# Patient Record
Sex: Female | Born: 1990 | Race: White | Hispanic: No | Marital: Married | Smoking: Former smoker
Health system: Southern US, Community
[De-identification: ages and names within clinical notes are randomized; demographics above are authoritative.]

## PROBLEM LIST (undated history)

## (undated) ENCOUNTER — Inpatient Hospital Stay (HOSPITAL_COMMUNITY): Payer: Self-pay

## (undated) DIAGNOSIS — Z789 Other specified health status: Secondary | ICD-10-CM

## (undated) DIAGNOSIS — R011 Cardiac murmur, unspecified: Secondary | ICD-10-CM

## (undated) DIAGNOSIS — R87629 Unspecified abnormal cytological findings in specimens from vagina: Secondary | ICD-10-CM

## (undated) DIAGNOSIS — N809 Endometriosis, unspecified: Secondary | ICD-10-CM

## (undated) HISTORY — PX: MOUTH SURGERY: SHX715

## (undated) HISTORY — PX: COLPOSCOPY VULVA W/ BIOPSY: SUR282

---
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
## 2009-04-30 ENCOUNTER — Observation Stay (HOSPITAL_COMMUNITY): Admission: EM | Admit: 2009-04-30 | Discharge: 2009-04-30 | Payer: Self-pay | Admitting: Emergency Medicine

## 2009-09-19 ENCOUNTER — Emergency Department (HOSPITAL_COMMUNITY): Admission: EM | Admit: 2009-09-19 | Discharge: 2009-09-19 | Payer: Self-pay | Admitting: Family Medicine

## 2010-09-15 LAB — POCT URINALYSIS DIP (DEVICE)
Bilirubin Urine: NEGATIVE
Nitrite: POSITIVE — AB
Specific Gravity, Urine: 1.025 (ref 1.005–1.030)

## 2010-09-24 LAB — URINE MICROSCOPIC-ADD ON

## 2010-09-24 LAB — URINALYSIS, ROUTINE W REFLEX MICROSCOPIC
Bilirubin Urine: NEGATIVE
Leukocytes, UA: NEGATIVE
Specific Gravity, Urine: 1.03 (ref 1.005–1.030)

## 2010-09-24 LAB — POCT PREGNANCY, URINE: Preg Test, Ur: NEGATIVE

## 2011-03-01 ENCOUNTER — Encounter (HOSPITAL_COMMUNITY): Payer: Self-pay | Admitting: Obstetrics and Gynecology

## 2011-03-01 ENCOUNTER — Inpatient Hospital Stay (HOSPITAL_COMMUNITY)
Admission: AD | Admit: 2011-03-01 | Discharge: 2011-03-01 | Disposition: A | Discharge status: Home / Self Care | Payer: Self-pay | Source: Ambulatory Visit | Attending: Obstetrics & Gynecology | Admitting: Obstetrics & Gynecology

## 2011-03-01 DIAGNOSIS — N949 Unspecified condition associated with female genital organs and menstrual cycle: Secondary | ICD-10-CM | POA: Insufficient documentation

## 2011-03-01 DIAGNOSIS — B3731 Acute candidiasis of vulva and vagina: Secondary | ICD-10-CM

## 2011-03-01 DIAGNOSIS — B373 Candidiasis of vulva and vagina: Secondary | ICD-10-CM

## 2011-03-01 HISTORY — DX: Other specified health status: Z78.9

## 2011-03-01 LAB — WET PREP, GENITAL: Clue Cells Wet Prep HPF POC: NONE SEEN

## 2011-03-01 LAB — POCT PREGNANCY, URINE: Preg Test, Ur: NEGATIVE

## 2011-03-01 LAB — URINE MICROSCOPIC-ADD ON: RBC / HPF: NONE SEEN RBC/hpf (ref <3)

## 2011-03-01 LAB — URINALYSIS, ROUTINE W REFLEX MICROSCOPIC
Glucose, UA: NEGATIVE mg/dL
Hgb urine dipstick: NEGATIVE
Ketones, ur: NEGATIVE mg/dL
Protein, ur: NEGATIVE mg/dL
Specific Gravity, Urine: 1.01 (ref 1.005–1.030)

## 2011-03-01 MED ORDER — NYSTATIN-TRIAMCINOLONE 100000-0.1 UNIT/GM-% EX OINT: TOPICAL_OINTMENT | Freq: Two times a day (BID) | CUTANEOUS | Status: DC

## 2011-03-01 MED ORDER — FLUCONAZOLE 150 MG PO TABS
150.0000 mg | ORAL_TABLET | Freq: Once | ORAL | Status: AC
  Administered 2011-03-01: 150 mg via ORAL
  Filled 2011-03-01: qty 1

## 2011-03-01 MED ORDER — NYSTATIN-TRIAMCINOLONE 100000-0.1 UNIT/GM-% EX CREA
TOPICAL_CREAM | Freq: Two times a day (BID) | CUTANEOUS | Status: DC
  Filled 2011-03-01: qty 30

## 2011-03-01 MED ORDER — FLUCONAZOLE 150 MG PO TABS: 150.0000 mg | ORAL_TABLET | Freq: Once | ORAL | Status: AC

## 2011-03-01 NOTE — Progress Notes (Signed)
Having vaginal pain on left side no visible cut or laceration, vaginal discharge, no itching.

## 2011-03-01 NOTE — ED Provider Notes (Signed)
History     Chief Complaint  Patient presents with  . Vaginal Pain   HPI Pt is not pregnant and complanis of vaginal pain, especially on the left side. She called the office (Dr. Johnn Hai) 2 days ago and started using Terazol vaginal suppositories with some relief.  Pt has not been sexually active in 9 months.  Pt was checked for GC and Chlamydia in July at Dr. Johnn Hai office, which was negative.  Past Medical History  Diagnosis Date  . No pertinent past medical history     Past Surgical History  Procedure Date  . No past surgeries     No family history on file.  History  Substance Use Topics  . Smoking status: Never Smoker   . Smokeless tobacco: Not on file  . Alcohol Use: No    Allergies: No Known Allergies  Prescriptions prior to admission  Medication Sig Dispense Refill  . Cholecalciferol (VITAMIN D) 2000 UNITS tablet Take 2,000 Units by mouth at bedtime.        . Multiple Vitamin (MULTIVITAMIN) tablet Take 1 tablet by mouth at bedtime.        . naproxen sodium (ALEVE) 220 MG tablet Take 220 mg by mouth 2 (two) times daily as needed. For pain       . Phenyleph-Doxyl-DM-Aspirin (ALKA-SELTZER PLUS NIGHT COLD PO) Take 2 capsules by mouth at bedtime as needed. For cough/cold symptoms      . Probiotic Product (PROBIOTIC PO) Take 1 capsule by mouth at bedtime. Bifantis       . terconazole (TERAZOL 3) 80 MG vaginal suppository Place 80 mg vaginally at bedtime.         ROS Physical Exam   Blood pressure 126/84, pulse 107, temperature 98.7 F (37.1 C), temperature source Oral, resp. rate 16, height 4\' 11"  (1.499 m), weight 118 lb 12.8 oz (53.887 kg), last menstrual period 02/21/2011.  Physical Exam  Nursing note and vitals reviewed. Constitutional: She is oriented to person, place, and time. She appears well-developed and well-nourished.  HENT:  Head: Normocephalic.  Eyes: Pupils are equal, round, and reactive to light.  Neck: Normal range of motion. Neck supple.    Cardiovascular: Normal rate.   Respiratory: Effort normal.  GI: Soft. There is no tenderness.  Genitourinary:       Labia majora slightly edematous with left slightly more than right- no lesions, focal tenderness or ulcerations or fissures noted; small amount of yellow creamy discharge in vault; cervix clean nontender; adnexal without palpable enlargement or tenderness  Musculoskeletal: Normal range of motion.  Neurological: She is alert and oriented to person, place, and time.  Skin: Skin is warm and dry.  Psychiatric: She has a normal mood and affect.    MAU Course  Procedures  Exam with wet prep performed Will treat with Diflucan and Myclog  Assessment and Plan  Yeast vulvovaginitis Treat with Mycolog ointment and Diflucan  [name] [name] 03/01/2011, 6:28 PM

## 2011-03-01 NOTE — Progress Notes (Signed)
Pt states she is having vaginal pain. Pt states she feels like the pain is either on her labia or inside the vagina (> on left side). Pt states the pain started on 9/4. Pt is a G0

## 2011-03-02 NOTE — ED Provider Notes (Signed)
Agree with above note.  Mckell Riecke H. 03/02/2011 5:56 AM

## 2011-07-15 ENCOUNTER — Emergency Department (INDEPENDENT_AMBULATORY_CARE_PROVIDER_SITE_OTHER)
Admission: EM | Admit: 2011-07-15 | Discharge: 2011-07-15 | Disposition: A | Discharge status: Home / Self Care | Payer: Self-pay | Source: Home / Self Care

## 2011-07-15 ENCOUNTER — Encounter (HOSPITAL_COMMUNITY): Payer: Self-pay

## 2011-07-15 DIAGNOSIS — J019 Acute sinusitis, unspecified: Secondary | ICD-10-CM

## 2011-07-15 DIAGNOSIS — K5900 Constipation, unspecified: Secondary | ICD-10-CM

## 2011-07-15 DIAGNOSIS — J209 Acute bronchitis, unspecified: Secondary | ICD-10-CM

## 2011-07-15 MED ORDER — AMOXICILLIN 875 MG PO TABS: 875.0000 mg | ORAL_TABLET | Freq: Two times a day (BID) | ORAL | Status: AC

## 2011-07-15 NOTE — ED Notes (Signed)
C/o she has been sick w URI type syx for 3 weeks, , but this AM, had weakness in hand , and had problem holding spoon to feed child this AM; mother had Karlene Lineman (pt has not had any recent exposure tolive virus immunizations)

## 2011-07-15 NOTE — ED Provider Notes (Signed)
Medical screening examination/treatment/procedure(s) were performed by non-physician practitioner and as supervising physician I was immediately available for consultation/collaboration.  Raynald Blend, MD 07/15/11 1534

## 2011-07-15 NOTE — ED Provider Notes (Signed)
History     CSN: [id number]  Arrival date & time 07/15/11  1252   None     Chief Complaint  Patient presents with  . Dizziness    (Consider location/radiation/quality/duration/timing/severity/associated sxs/prior treatment) HPI Comments: Pt states she has been sick with cold symptoms for one month. She has yellow/green nasal mucus and a cough. The cough is sometimes productive with discolored phlegm. No shortness of breath or wheezing. She has tried otc cold medications without improvement. She has HA intermittently and fatigue. 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 She had difficulty holding a spoon to feed a baby because they felt so tired. The symptoms have since resolved. She also states that she intermittently has problems with constipation and is uncertain what to do with this. Most recently was constipated this past weekend but is having BMs now.    Past Medical History  Diagnosis Date  . No pertinent past medical history     Past Surgical History  Procedure Date  . No past surgeries     History reviewed. No pertinent family history.  History  Substance Use Topics  . Smoking status: Never Smoker   . Smokeless tobacco: Not on file  . Alcohol Use: No    OB History    Grav Para Term Preterm Abortions TAB SAB Ect Mult Living   0 0 0 0 0 0 0 0 0 0       Review of Systems  Constitutional: Negative for fever, chills and fatigue.  HENT: Positive for congestion, rhinorrhea and postnasal drip. Negative for ear pain, sore throat, sneezing, neck pain and sinus pressure.   Respiratory: Positive for cough. Negative for shortness of breath and wheezing.   Cardiovascular: Negative for chest pain and palpitations.    Allergies  Review of patient's allergies indicates no known allergies.  Home Medications   Current Outpatient Rx  Name Route Sig Dispense Refill  . VITAMIN D 2000 UNITS PO TABS Oral Take 2,000 Units by mouth at bedtime.        Marland Kitchen ONE-DAILY MULTI VITAMINS PO TABS Oral Take 1 tablet by mouth at bedtime.      Marland Kitchen NAPROXEN SODIUM 220 MG PO TABS Oral Take 220 mg by mouth 2 (two) times daily as needed. For pain     . ALKA-SELTZER PLUS NIGHT COLD PO Oral Take 2 capsules by mouth at bedtime as needed. For cough/cold symptoms    . PROBIOTIC PO Oral Take 1 capsule by mouth at bedtime. Bifantis     . AMOXICILLIN 875 MG PO TABS Oral Take 1 tablet (875 mg total) by mouth 2 (two) times daily. 20 tablet 0    BP 123/73  Pulse 76  Temp(Src) 98.3 F (36.8 C) (Oral)  Resp 18  SpO2 100%  LMP 07/10/2011  Physical Exam  Nursing note and vitals reviewed. Constitutional: She appears well-developed and well-nourished. No distress.  HENT:  Head: Normocephalic and atraumatic.  Right Ear: Tympanic membrane, external ear and ear canal normal.  Left Ear: Tympanic membrane, external ear and ear canal normal.  Nose: Mucosal edema (mild turbinate swelling and erythema bilat) present.  Mouth/Throat: Uvula is midline, oropharynx is clear and moist and mucous membranes are normal. No oropharyngeal exudate, posterior oropharyngeal edema or posterior oropharyngeal erythema.  Neck: Neck supple.  Cardiovascular: Normal rate, regular rhythm and normal heart sounds.   Pulses:      Radial pulses are 2+ on the right side, and 2+ on the left  side.       Dorsalis pedis pulses are 2+ on the right side, and 2+ on the left side.       Posterior tibial pulses are 2+ on the right side, and 2+ on the left side.       Cap refill < 2 sec  Pulmonary/Chest: Effort normal and breath sounds normal. No respiratory distress.  Lymphadenopathy:    She has no cervical adenopathy.  Neurological: She is alert. She has normal strength.  Reflex Scores:      Bicep reflexes are 2+ on the right side and 2+ on the left side.      Patellar reflexes are 2+ on the right side and 2+ on the left side. Skin: Skin is warm and dry.  Psychiatric: She has a normal mood and affect.     ED Course  Procedures (including critical care time)  Labs Reviewed - No data to display No results found.   1. Acute sinusitis   2. Acute bronchitis   3. Constipation       MDM  Sinus congestion and cough symptoms x 3-4 weeks. Episode of bilat UE weakness this am - neg exam.  Hx of recurrent constipation.         Melody Comas, [place] 07/15/11 1425

## 2011-11-22 ENCOUNTER — Emergency Department (HOSPITAL_COMMUNITY)
Admission: EM | Admit: 2011-11-22 | Discharge: 2011-11-22 | Disposition: A | Discharge status: Home / Self Care | Payer: Self-pay | Source: Home / Self Care | Attending: Emergency Medicine | Admitting: Emergency Medicine

## 2011-11-22 ENCOUNTER — Encounter (HOSPITAL_COMMUNITY): Payer: Self-pay | Admitting: Emergency Medicine

## 2011-11-22 DIAGNOSIS — K625 Hemorrhage of anus and rectum: Secondary | ICD-10-CM

## 2011-11-22 DIAGNOSIS — J069 Acute upper respiratory infection, unspecified: Secondary | ICD-10-CM

## 2011-11-22 NOTE — Discharge Instructions (Signed)
Drink LOTS of liquids. Try hot herbal tea (such as peppermint) with honey and fresh lemon squeezed into it (the lemon can really help sore throats). Try sudafed for congestion. Use a neti pot several times a day as needed to help manage your congestion and sore throat. Use only distilled water and salt without iodine in it (such as kosher or sea salt). You can also use the salt packets that come with some neti pots. If you mix your own saline, the recipe is 1 cup warm water to 1/4 teaspoon salt.   Increase the fiber in your diet.  If you continue to have rectal bleeding, if it's getting worse, go see your doctor about it.   Upper Respiratory Infection, Adult An upper respiratory infection (URI) is also sometimes known as the common cold. The upper respiratory tract includes the nose, sinuses, throat, trachea, and bronchi. Bronchi are the airways leading to the lungs. Most people improve within 1 week, but symptoms can last up to 2 weeks. A residual cough may last even longer.  CAUSES Many different viruses can infect the tissues lining the upper respiratory tract. The tissues become irritated and inflamed and often become very moist. Mucus production is also common. A cold is contagious. You can easily spread the virus to others by oral contact. This includes kissing, sharing a glass, coughing, or sneezing. Touching your mouth or nose and then touching a surface, which is then touched by another person, can also spread the virus. SYMPTOMS  Symptoms typically develop 1 to 3 days after you come in contact with a cold virus. Symptoms vary from person to person. They may include:  Runny nose.   Sneezing.   Nasal congestion.   Sinus irritation.   Sore throat.   Loss of voice (laryngitis).   Cough.   Fatigue.   Muscle aches.   Loss of appetite.   Headache.   Low-grade fever.  DIAGNOSIS  You might diagnose your own cold based on familiar symptoms, since most people get a cold 2 to 3 times  a year. Your caregiver can confirm this based on your exam. Most importantly, your caregiver can check that your symptoms are not due to another disease such as strep throat, sinusitis, pneumonia, asthma, or epiglottitis. Blood tests, throat tests, and X-rays are not necessary to diagnose a common cold, but they may sometimes be helpful in excluding other more serious diseases. Your caregiver will decide if any further tests are required. RISKS AND COMPLICATIONS  You may be at risk for a more severe case of the common cold if you smoke cigarettes, have chronic heart disease (such as heart failure) or lung disease (such as asthma), or if you have a weakened immune system. The very young and very old are also at risk for more serious infections. Bacterial sinusitis, middle ear infections, and bacterial pneumonia can complicate the common cold. The common cold can worsen asthma and chronic obstructive pulmonary disease (COPD). Sometimes, these complications can require emergency medical care and may be life-threatening. PREVENTION  The best way to protect against getting a cold is to practice good hygiene. Avoid oral or hand contact with people with cold symptoms. Wash your hands often if contact occurs. There is no clear evidence that vitamin C, vitamin E, echinacea, or exercise reduces the chance of developing a cold. However, it is always recommended to get plenty of rest and practice good nutrition. TREATMENT  Treatment is directed at relieving symptoms. There is no cure. Antibiotics are  not effective, because the infection is caused by a virus, not by bacteria. Treatment may include:  Increased fluid intake. Sports drinks offer valuable electrolytes, sugars, and fluids.   Breathing heated mist or steam (vaporizer or shower).   Eating chicken soup or other clear broths, and maintaining good nutrition.   Getting plenty of rest.   Using gargles or lozenges for comfort.   Controlling fevers with  ibuprofen or acetaminophen as directed by your caregiver.   Increasing usage of your inhaler if you have asthma.  Zinc gel and zinc lozenges, taken in the first 24 hours of the common cold, can shorten the duration and lessen the severity of symptoms. Pain medicines may help with fever, muscle aches, and throat pain. A variety of non-prescription medicines are available to treat congestion and runny nose. Your caregiver can make recommendations and may suggest nasal or lung inhalers for other symptoms.  HOME CARE INSTRUCTIONS   Only take over-the-counter or prescription medicines for pain, discomfort, or fever as directed by your caregiver.   Use a warm mist humidifier or inhale steam from a shower to increase air moisture. This may keep secretions moist and make it easier to breathe.   Drink enough water and fluids to keep your urine clear or pale yellow.   Rest as needed.   Return to work when your temperature has returned to normal or as your caregiver advises. You may need to stay home longer to avoid infecting others. You can also use a face mask and careful hand washing to prevent spread of the virus.  SEEK MEDICAL CARE IF:   After the first few days, you feel you are getting worse rather than better.   You need your caregiver's advice about medicines to control symptoms.   You develop chills, worsening shortness of breath, or brown or red sputum. These may be signs of pneumonia.   You develop yellow or brown nasal discharge or pain in the face, especially when you bend forward. These may be signs of sinusitis.   You develop a fever, swollen neck glands, pain with swallowing, or white areas in the back of your throat. These may be signs of strep throat.  SEEK IMMEDIATE MEDICAL CARE IF:   You have a fever.   You develop severe or persistent headache, ear pain, sinus pain, or chest pain.   You develop wheezing, a prolonged cough, cough up blood, or have a change in your usual  mucus (if you have chronic lung disease).   You develop sore muscles or a stiff neck.  Document Released: 12/02/2000 Document Revised: 05/28/2011 Document Reviewed: 10/10/2010 [facility] Patient Information 2012 [place], [place].  Rectal Bleeding  Rectal bleeding is when blood comes out of the opening of the butt (anus). Rectal bleeding may show up as bright red blood or really dark poop (stool). The poop may look dark red, maroon, or black. Rectal bleeding is often a sign that something is wrong. This needs to be checked by a doctor.  HOME CARE  Eat a diet high in fiber. This will help keep your poop soft.   Limit activitiy.   Drink enough fluids to keep your pee (urine) clear or pale yellow.   Take a warm bath to soothe any pain.   Follow up with your doctor as told.  GET HELP RIGHT AWAY IF:  You have more bleeding.   You have black or dark red poop.   You throw up (vomit) blood or it looks like coffee  grounds.   You have belly (abdominal) pain or tenderness.   You have a fever.   You feel weak, sick to your stomach (nauseous), or you pass out (faint).   You have pain that is so bad you cannot poop (bowel movement).  MAKE SURE YOU:  Understand these instructions.   Will watch your condition.   Will get help right away if you are not doing well or get worse.  Document Released: 02/18/2011 Document Revised: 05/28/2011 Document Reviewed: 02/18/2011 [facility] Patient Information 2012 [place], [place].

## 2011-11-22 NOTE — ED Provider Notes (Signed)
Medical screening examination/treatment/procedure(s) were performed by non-physician practitioner and as supervising physician I was immediately available for consultation/collaboration.  Marriana Hibberd, M.D.   Nishaan Stanke C Towana Stenglein, MD 11/22/11 2035 

## 2011-11-22 NOTE — ED Notes (Signed)
onset Thursday of symptoms: stuffy nose and head, cough, sore throat.  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  Denies rashes, denies fever.

## 2011-11-22 NOTE — ED Provider Notes (Signed)
History     CSN: [id number]  Arrival date & time 11/22/11  1600   First MD Initiated Contact with Patient 11/22/11 1651      Chief Complaint  Patient presents with  . Sore Throat    (Consider location/radiation/quality/duration/timing/severity/associated sxs/prior treatment) HPI Comments: Pt with nasal congestion, cough, sore throat, headache for 3 days.  Using otc cold meds for no relief.  Incidentally mentions small amount bright red rectal bleeding with stools for last 2 weeks. Occasionally constipated/has to strain.   Patient is a 21 y.o. female presenting with pharyngitis. The history is provided by the patient.  Sore Throat This is a new problem. The current episode started more than 2 days ago. The problem occurs constantly. The problem has not changed since onset.Pertinent negatives include no shortness of breath. The symptoms are aggravated by coughing. The symptoms are relieved by nothing. Treatments tried: otc cold meds. The treatment provided no relief.    Past Medical History  Diagnosis Date  . No pertinent past medical history     Past Surgical History  Procedure Date  . Mouth surgery     No family history on file.  History  Substance Use Topics  . Smoking status: Never Smoker   . Smokeless tobacco: Not on file  . Alcohol Use: No    OB History    Grav Para Term Preterm Abortions TAB SAB Ect Mult Living   0 0 0 0 0 0 0 0 0 0       Review of Systems  Constitutional: Negative for fever and chills.  HENT: Positive for congestion, sore throat, postnasal drip and sinus pressure. Negative for ear pain and ear discharge.   Respiratory: Positive for cough. Negative for shortness of breath and wheezing.   Gastrointestinal: Positive for anal bleeding.  Musculoskeletal: Positive for back pain.       Back pain with coughing    Allergies  Review of patient's allergies indicates no known allergies.  Home Medications   Current Outpatient Rx  Name Route Sig  Dispense Refill  . VICKS NATURE FUSION COLD & FLU PO Oral Take by mouth.    Marland Kitchen VITAMIN D 2000 UNITS PO TABS Oral Take 2,000 Units by mouth at bedtime.      Marland Kitchen ONE-DAILY MULTI VITAMINS PO TABS Oral Take 1 tablet by mouth at bedtime.      Marland Kitchen NAPROXEN SODIUM 220 MG PO TABS Oral Take 220 mg by mouth 2 (two) times daily as needed. For pain     . ALKA-SELTZER PLUS NIGHT COLD PO Oral Take 2 capsules by mouth at bedtime as needed. For cough/cold symptoms    . PROBIOTIC PO Oral Take 1 capsule by mouth at bedtime. Bifantis       BP 129/86  Pulse 118  Temp(Src) 98.6 F (37 C) (Oral)  Resp 20  SpO2 100%  LMP 11/22/2011  Physical Exam  Constitutional: She appears well-developed and well-nourished. No distress.  HENT:  Right Ear: External ear and ear canal normal. Tympanic membrane is retracted.  Left Ear: External ear and ear canal normal. Tympanic membrane is retracted.  Nose: Mucosal edema present.  Mouth/Throat: Oropharynx is clear and moist.  Cardiovascular: Normal rate and regular rhythm.   Pulmonary/Chest: Effort normal and breath sounds normal.  Genitourinary: Guaiac stool: refused rectal exam.  Lymphadenopathy:       Head (right side): No submental, no submandibular, no tonsillar, no preauricular and no posterior auricular adenopathy present.  Head (left side): No submental, no submandibular, no tonsillar, no preauricular and no posterior auricular adenopathy present.    She has no cervical adenopathy.    ED Course  Procedures (including critical care time)  Labs Reviewed - No data to display No results found.   1. URI (upper respiratory infection)   2. Rectal bleeding       MDM  Pt refused rectal exam. Sx sound most likely like fissure or hemorrhoids.  Pt has pcp and is to f/u with him if bleeding continues.          Cathlyn Parsons, NP 11/22/11 1701

## 2012-03-20 ENCOUNTER — Encounter (HOSPITAL_COMMUNITY): Payer: Self-pay | Admitting: Emergency Medicine

## 2012-03-20 ENCOUNTER — Emergency Department (INDEPENDENT_AMBULATORY_CARE_PROVIDER_SITE_OTHER)
Admission: EM | Admit: 2012-03-20 | Discharge: 2012-03-20 | Disposition: A | Discharge status: Home / Self Care | Payer: Self-pay | Source: Home / Self Care | Attending: Family Medicine | Admitting: Family Medicine

## 2012-03-20 DIAGNOSIS — Z041 Encounter for examination and observation following transport accident: Secondary | ICD-10-CM

## 2012-03-20 DIAGNOSIS — M549 Dorsalgia, unspecified: Secondary | ICD-10-CM

## 2012-03-20 MED ORDER — IBUPROFEN 800 MG PO TABS: 800.0000 mg | ORAL_TABLET | Freq: Three times a day (TID) | ORAL | Status: DC
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
## 2012-06-08 ENCOUNTER — Emergency Department (INDEPENDENT_AMBULATORY_CARE_PROVIDER_SITE_OTHER)
Admission: EM | Admit: 2012-06-08 | Discharge: 2012-06-08 | Disposition: A | Discharge status: Home / Self Care | Payer: Self-pay | Source: Home / Self Care | Attending: Family Medicine | Admitting: Family Medicine

## 2012-06-08 ENCOUNTER — Encounter (HOSPITAL_COMMUNITY): Payer: Self-pay | Admitting: *Deleted

## 2012-06-08 DIAGNOSIS — J020 Streptococcal pharyngitis: Secondary | ICD-10-CM

## 2012-06-08 LAB — POCT RAPID STREP A: Streptococcus, Group A Screen (Direct): NEGATIVE

## 2012-06-08 MED ORDER — AMOXICILLIN 500 MG PO CAPS: 500.0000 mg | ORAL_CAPSULE | Freq: Three times a day (TID) | ORAL | Status: DC

## 2012-06-08 NOTE — ED Notes (Signed)
Pt   Reports  Symptoms  Of  Body  Aches   sorethroat  As  Well   As  fever      And  Earache   Symptoms          Started  2  Days  Ago        Symptoms  Not  releived  By  otc  meds     Pt is  Masked  And  ios  In a  privatte  Room

## 2012-06-08 NOTE — ED Provider Notes (Signed)
History     CSN: [id number]  Arrival date & time 06/08/12  1548   First MD Initiated Contact with Patient 06/08/12 1706      Chief Complaint  Patient presents with  . Fever    (Consider location/radiation/quality/duration/timing/severity/associated sxs/prior treatment) Patient is a 21 y.o. female presenting with pharyngitis. The history is provided by the patient and a relative.  Sore Throat This is a new problem. The current episode started 2 days ago. The problem has been gradually worsening. The symptoms are aggravated by swallowing.    Past Medical History  Diagnosis Date  . No pertinent past medical history     Past Surgical History  Procedure Date  . Mouth surgery     History reviewed. No pertinent family history.  History  Substance Use Topics  . Smoking status: Never Smoker   . Smokeless tobacco: Not on file  . Alcohol Use: No    OB History    Grav Para Term Preterm Abortions TAB SAB Ect Mult Living   0 0 0 0 0 0 0 0 0 0       Review of Systems  Constitutional: Positive for fever and chills.  HENT: Positive for congestion, sore throat and rhinorrhea. Negative for trouble swallowing.   Respiratory: Positive for cough.   Gastrointestinal: Negative.   Musculoskeletal: Positive for myalgias.    Allergies  Review of patient's allergies indicates no known allergies.  Home Medications   Current Outpatient Rx  Name  Route  Sig  Dispense  Refill  . AMOXICILLIN 500 MG PO CAPS   Oral   Take 1 capsule (500 mg total) by mouth 3 (three) times daily.   30 capsule   0   . VITAMIN D 2000 UNITS PO TABS   Oral   Take 2,000 Units by mouth at bedtime.           Marland Kitchen VICKS NATURE FUSION COLD & FLU PO   Oral   Take by mouth.         . IBUPROFEN 800 MG PO TABS   Oral   Take 1 tablet (800 mg total) by mouth 3 (three) times daily.   30 tablet   0   . ONE-DAILY MULTI VITAMINS PO TABS   Oral   Take 1 tablet by mouth at bedtime.           Marland Kitchen NAPROXEN  SODIUM 220 MG PO TABS   Oral   Take 220 mg by mouth 2 (two) times daily as needed. For pain          . ALKA-SELTZER PLUS NIGHT COLD PO   Oral   Take 2 capsules by mouth at bedtime as needed. For cough/cold symptoms         . PROBIOTIC PO   Oral   Take 1 capsule by mouth at bedtime. Bifantis            BP 118/76  Pulse 112  Temp 100.6 F (38.1 C) (Oral)  Resp 20  SpO2 100%  LMP 06/02/2012  Physical Exam  Nursing note and vitals reviewed. Constitutional: She is oriented to person, place, and time. She appears well-developed and well-nourished.  HENT:  Head: Normocephalic.  Right Ear: External ear normal.  Left Ear: External ear normal.  Mouth/Throat: Mucous membranes are normal. Oropharyngeal exudate and posterior oropharyngeal erythema present.  Eyes: Pupils are equal, round, and reactive to light.  Cardiovascular: Normal rate, regular rhythm, normal heart sounds and intact distal pulses.  Pulmonary/Chest: Effort normal and breath sounds normal.  Lymphadenopathy:    She has cervical adenopathy.  Neurological: She is alert and oriented to person, place, and time.  Skin: Skin is warm and dry.    ED Course  Procedures (including critical care time)   Labs Reviewed  POCT RAPID STREP A ([facility] ONLY)   No results found.   1. Strep throat       MDM          Linna Hoff, MD 06/08/12 [phone]

## 2013-01-16 ENCOUNTER — Encounter (HOSPITAL_COMMUNITY): Payer: Self-pay | Admitting: Emergency Medicine

## 2013-01-16 ENCOUNTER — Emergency Department (INDEPENDENT_AMBULATORY_CARE_PROVIDER_SITE_OTHER)
Admission: EM | Admit: 2013-01-16 | Discharge: 2013-01-16 | Disposition: A | Discharge status: Home / Self Care | Payer: Self-pay | Source: Home / Self Care | Attending: Family Medicine | Admitting: Family Medicine

## 2013-01-16 DIAGNOSIS — J209 Acute bronchitis, unspecified: Secondary | ICD-10-CM

## 2013-01-16 MED ORDER — GUAIFENESIN-CODEINE 100-10 MG/5ML PO SOLN: 5.0000 mL | Freq: Three times a day (TID) | ORAL | Status: DC | PRN

## 2013-01-16 NOTE — ED Notes (Signed)
Pt c/o cold sxs onset 3 days... sxs include: cough w/green phlegm, sneezing, ST... Denies: f/v/n/d... Had tyle around 1600... Hx of bronchitis... Alert w/no signs of acute distres

## 2013-01-16 NOTE — ED Provider Notes (Signed)
CSN: [id number]     Arrival date & time 01/16/13  1822 History     First MD Initiated Contact with Patient 01/16/13 1949     Chief Complaint  Patient presents with  . URI   (Consider location/radiation/quality/duration/timing/severity/associated sxs/prior Treatment) HPI Comments: 22 year old female with productive cough, congestion, sore throat, runny nose for three days.  She says she has had bronchitis many times in the past and it feels just like this. She says she usually takes codeine cough syrup at night so she can sleep and waits it out. No fever, chills, SOB, pleuritic PX. 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 No recent travel.   Patient is a 22 y.o. female presenting with URI.  URI Presenting symptoms: cough and sore throat   Presenting symptoms: no congestion, no ear pain, no fever and no rhinorrhea   Associated symptoms: sneezing   Associated symptoms: no arthralgias, no myalgias and no neck pain     Past Medical History  Diagnosis Date  . No pertinent past medical history    Past Surgical History  Procedure Laterality Date  . Mouth surgery     No family history on file. History  Substance Use Topics  . Smoking status: Never Smoker   . Smokeless tobacco: Not on file  . Alcohol Use: No   OB History   Grav Para Term Preterm Abortions TAB SAB Ect Mult Living   0 0 0 0 0 0 0 0 0 0      Review of Systems  Constitutional: Negative for fever and chills.  HENT: Positive for sore throat and sneezing. Negative for ear pain, congestion, rhinorrhea, neck pain, postnasal drip and sinus pressure.   Eyes: Negative for visual disturbance.  Respiratory: Positive for cough. Negative for shortness of breath.   Cardiovascular: Negative for chest pain, palpitations and leg swelling.  Gastrointestinal: Negative for nausea, vomiting and abdominal pain.  Endocrine: Negative for polydipsia and polyuria.  Genitourinary: Negative for dysuria, urgency and  frequency.  Musculoskeletal: Negative for myalgias and arthralgias.  Skin: Negative for rash.  Neurological: Negative for dizziness, weakness and light-headedness.    Allergies  Review of patient's allergies indicates no known allergies.  Home Medications   Current Outpatient Rx  Name  Route  Sig  Dispense  Refill  . amoxicillin (AMOXIL) 500 MG capsule   Oral   Take 1 capsule (500 mg total) by mouth 3 (three) times daily.   30 capsule   0   . Cholecalciferol (VITAMIN D) 2000 UNITS tablet   Oral   Take 2,000 Units by mouth at bedtime.           Marland Kitchen DM-Phenylephrine-Acetaminophen (VICKS NATURE FUSION COLD & FLU PO)   Oral   Take by mouth.         Marland Kitchen guaiFENesin-codeine 100-10 MG/5ML syrup   Oral   Take 5 mLs by mouth 3 (three) times daily as needed for cough.   120 mL   0   . ibuprofen (ADVIL,MOTRIN) 800 MG tablet   Oral   Take 1 tablet (800 mg total) by mouth 3 (three) times daily.   30 tablet   0   . Multiple Vitamin (MULTIVITAMIN) tablet   Oral   Take 1 tablet by mouth at bedtime.           . naproxen sodium (ALEVE) 220 MG tablet   Oral   Take 220 mg by mouth 2 (two) times daily as  needed. For pain          . Phenyleph-Doxyl-DM-Aspirin (ALKA-SELTZER PLUS NIGHT COLD PO)   Oral   Take 2 capsules by mouth at bedtime as needed. For cough/cold symptoms         . Probiotic Product (PROBIOTIC PO)   Oral   Take 1 capsule by mouth at bedtime. Bifantis           BP 131/82  Pulse 75  Temp(Src) 98 F (36.7 C) (Oral)  Resp 16  SpO2 100%  LMP 01/07/2013 Physical Exam  Nursing note and vitals reviewed. Constitutional: She is oriented to person, place, and time. Vital signs are normal. She appears well-developed and well-nourished. No distress.  HENT:  Head: Normocephalic and atraumatic.  Right Ear: External ear normal.  Left Ear: External ear normal.  Nose: Nose normal.  Mouth/Throat: Oropharynx is clear and moist. No oropharyngeal exudate.  Eyes:  Conjunctivae and EOM are normal. Pupils are equal, round, and reactive to light. Right eye exhibits no discharge. Left eye exhibits no discharge.  Neck: Normal range of motion. Neck supple. No JVD present. No tracheal deviation present.  Cardiovascular: Normal rate, regular rhythm and normal heart sounds.  Exam reveals no gallop and no friction rub.   No murmur heard. Pulmonary/Chest: Effort normal. No respiratory distress. She has wheezes (mild in right lower lung field). She has no rales.  Lymphadenopathy:    She has no cervical adenopathy.  Neurological: She is alert and oriented to person, place, and time. She has normal strength.  Skin: Skin is warm and dry. She is not diaphoretic.  Psychiatric: She has a normal mood and affect. Her behavior is normal. Judgment normal.    ED Course   Procedures (including critical care time)  Labs Reviewed - No data to display No results found. 1. Acute bronchitis     MDM  Afebrile, without SOB, no pleuritic pain.  This is a viral URI.  Treat symptomatically, return if worsening    Meds ordered this encounter  Medications  . guaiFENesin-codeine 100-10 MG/5ML syrup    Sig: Take 5 mLs by mouth 3 (three) times daily as needed for cough.    Dispense:  120 mL    Refill:  0     Graylon Good, PA-C 01/17/13 [phone]

## 2013-01-17 NOTE — ED Provider Notes (Signed)
Medical screening examination/treatment/procedure(s) were performed by resident physician or non-physician practitioner and as supervising physician I was immediately available for consultation/collaboration.   Chukwuebuka Churchill DOUGLAS MD.   Chelsee Hosie D Nila Winker, MD 01/17/13 2110 

## 2013-11-01 ENCOUNTER — Other Ambulatory Visit: Payer: Self-pay | Admitting: Obstetrics & Gynecology

## 2013-11-01 DIAGNOSIS — N63 Unspecified lump in unspecified breast: Secondary | ICD-10-CM

## 2013-11-07 ENCOUNTER — Ambulatory Visit
Admission: RE | Admit: 2013-11-07 | Discharge: 2013-11-07 | Disposition: A | Discharge status: Home / Self Care | Payer: No Typology Code available for payment source | Source: Ambulatory Visit | Attending: Obstetrics & Gynecology | Admitting: Obstetrics & Gynecology

## 2013-11-07 ENCOUNTER — Encounter (INDEPENDENT_AMBULATORY_CARE_PROVIDER_SITE_OTHER): Payer: Self-pay

## 2013-11-07 DIAGNOSIS — N63 Unspecified lump in unspecified breast: Secondary | ICD-10-CM

## 2014-08-31 ENCOUNTER — Inpatient Hospital Stay (HOSPITAL_COMMUNITY)
Admission: AD | Admit: 2014-08-31 | Discharge: 2014-09-01 | Disposition: A | Discharge status: Home / Self Care | Payer: Self-pay | Source: Ambulatory Visit | Attending: Obstetrics and Gynecology | Admitting: Obstetrics and Gynecology

## 2014-08-31 DIAGNOSIS — Z87891 Personal history of nicotine dependence: Secondary | ICD-10-CM | POA: Insufficient documentation

## 2014-08-31 DIAGNOSIS — O209 Hemorrhage in early pregnancy, unspecified: Secondary | ICD-10-CM | POA: Insufficient documentation

## 2014-08-31 DIAGNOSIS — Z3A01 Less than 8 weeks gestation of pregnancy: Secondary | ICD-10-CM | POA: Insufficient documentation

## 2014-08-31 DIAGNOSIS — O4691 Antepartum hemorrhage, unspecified, first trimester: Secondary | ICD-10-CM

## 2014-08-31 HISTORY — DX: Unspecified abnormal cytological findings in specimens from vagina: R87.629

## 2014-08-31 NOTE — MAU Note (Signed)
Bleeding like a period for couple hours. Last BM 3 days ago and has been taking Miralax to help with constipation. Has had abd pressure from constipation and continues to have that feeling but no pain. Using metrogel this wk for BV.

## 2014-09-01 ENCOUNTER — Encounter (HOSPITAL_COMMUNITY): Payer: Self-pay | Admitting: *Deleted

## 2014-09-01 ENCOUNTER — Inpatient Hospital Stay (HOSPITAL_COMMUNITY): Payer: Self-pay

## 2014-09-01 LAB — HCG, QUANTITATIVE, PREGNANCY: hCG, Beta Chain, Quant, S: 57303 m[IU]/mL — ABNORMAL HIGH (ref <5)

## 2014-09-01 LAB — URINALYSIS, ROUTINE W REFLEX MICROSCOPIC
BILIRUBIN URINE: NEGATIVE
Glucose, UA: NEGATIVE mg/dL
KETONES UR: NEGATIVE mg/dL
Leukocytes, UA: NEGATIVE
Nitrite: NEGATIVE
Protein, ur: NEGATIVE mg/dL
Specific Gravity, Urine: 1.01 (ref 1.005–1.030)
Urobilinogen, UA: 0.2 mg/dL (ref 0.0–1.0)
pH: 6.5 (ref 5.0–8.0)

## 2014-09-01 LAB — CBC
HCT: 33.6 % — ABNORMAL LOW (ref 36.0–46.0)
Hemoglobin: 11.8 g/dL — ABNORMAL LOW (ref 12.0–15.0)
MCH: 29.9 pg (ref 26.0–34.0)
MCHC: 35.1 g/dL (ref 30.0–36.0)
MCV: 85.1 fL (ref 78.0–100.0)
PLATELETS: 279 10*3/uL (ref 150–400)
RBC: 3.95 MIL/uL (ref 3.87–5.11)
RDW: 12 % (ref 11.5–15.5)
WBC: 12.4 10*3/uL — ABNORMAL HIGH (ref 4.0–10.5)

## 2014-09-01 LAB — ABO/RH: ABO/RH(D): O POS

## 2014-09-01 LAB — URINE MICROSCOPIC-ADD ON

## 2014-09-01 LAB — POCT PREGNANCY, URINE: Preg Test, Ur: POSITIVE — AB

## 2014-09-01 NOTE — Discharge Instructions (Signed)
Pelvic Rest °Pelvic rest is sometimes recommended for women when:  °· The placenta is partially or completely covering the opening of the cervix (placenta previa). °· There is bleeding between the uterine wall and the amniotic sac in the first trimester (subchorionic hemorrhage). °· The cervix begins to open without labor starting (incompetent cervix, cervical insufficiency). °· The labor is too early (preterm labor). °HOME CARE INSTRUCTIONS °· Do not have sexual intercourse, stimulation, or an orgasm. °· Do not use tampons, douche, or put anything in the vagina. °· Do not lift anything over 10 pounds (4.5 kg). °· Avoid strenuous activity or straining your pelvic muscles. °SEEK MEDICAL CARE IF:  °· You have any vaginal bleeding during pregnancy. Treat this as a potential emergency. °· You have cramping pain felt low in the stomach (stronger than menstrual cramps). °· You notice vaginal discharge (watery, mucus, or bloody). °· You have a low, dull backache. °· There are regular contractions or uterine tightening. °SEEK IMMEDIATE MEDICAL CARE IF: °You have vaginal bleeding and have placenta previa.  °Document Released: 10/03/2010 Document Revised: 08/31/2011 Document Reviewed: 10/03/2010 °[employer]® Patient Information ©2015 [employer]. This information is not intended to replace advice given to you by your health care provider. Make sure you discuss any questions you have with your health care provider. ° °Vaginal Bleeding During Pregnancy, First Trimester °A small amount of bleeding (spotting) from the vagina is common in early pregnancy. Sometimes the bleeding is normal and is not a problem, and sometimes it is a sign of something serious. Be sure to tell your doctor about any bleeding from your vagina right away. °HOME CARE °· Watch your condition for any changes. °· Follow your doctor's instructions about how active you can be. °· If you are on bed rest: °¨ You may need to stay in bed and only get up to use the  bathroom. °¨ You may be allowed to do some activities. °¨ If you need help, make plans for someone to help you. °· Write down: °¨ The number of pads you use each day. °¨ How often you change pads. °¨ How soaked (saturated) your pads are. °· Do not use tampons. °· Do not douche. °· Do not have sex or orgasms until your doctor says it is okay. °· If you pass any tissue from your vagina, save the tissue so you can show it to your doctor. °· Only take medicines as told by your doctor. °· Do not take aspirin because it can make you bleed. °· Keep all follow-up visits as told by your doctor. °GET HELP IF:  °· You bleed from your vagina. °· You have cramps. °· You have labor pains. °· You have a fever that does not go away after you take medicine. °GET HELP RIGHT AWAY IF:  °· You have very bad cramps in your back or belly (abdomen). °· You pass large clots or tissue from your vagina. °· You bleed more. °· You feel light-headed or weak. °· You pass out (faint). °· You have chills. °· You are leaking fluid or have a gush of fluid from your vagina. °· You pass out while pooping (having a bowel movement). °MAKE SURE YOU: °· Understand these instructions. °· Will watch your condition. °· Will get help right away if you are not doing well or get worse. °Document Released: 10/23/2013 Document Reviewed: 02/13/2013 °[employer]Care® Patient Information ©2015 [employer]. This information is not intended to replace advice given to you by your health care provider. Make   sure you discuss any questions you have with your health care provider. ° °

## 2014-09-01 NOTE — MAU Provider Note (Signed)
History     CSN: [id number]  Arrival date and time: 08/31/14 2348   First Provider Initiated Contact with Patient 09/01/14 0020      Chief Complaint  Patient presents with  . Vaginal Bleeding   HPI  [name] [name] is a 24 y.o. female G1P0000 at [id number] who presents with new onset vaginal bleeding. The bleeding started a few hours ago and is heavy like a period.  Last intercourse was 48 hours ago.  Denies pain.    OB History    Gravida Para Term Preterm AB TAB SAB Ectopic Multiple Living   1 0 0 0 0 0 0 0 0 0       Past Medical History  Diagnosis Date  . No pertinent past medical history   . Vaginal Pap smear, abnormal     Past Surgical History  Procedure Laterality Date  . Mouth surgery    . Colposcopy vulva w/ biopsy      History reviewed. No pertinent family history.  History  Substance Use Topics  . Smoking status: Former Smoker    Quit date: 08/21/2014  . Smokeless tobacco: Not on file  . Alcohol Use: No    Allergies: No Known Allergies  Prescriptions prior to admission  Medication Sig Dispense Refill Last Dose  . amoxicillin (AMOXIL) 500 MG capsule Take 1 capsule (500 mg total) by mouth 3 (three) times daily. 30 capsule 0 Unknown at Unknown  . Cholecalciferol (VITAMIN D) 2000 UNITS tablet Take 2,000 Units by mouth at bedtime.     Unknown at Unknown  . DM-Phenylephrine-Acetaminophen (VICKS NATURE FUSION COLD & FLU PO) Take by mouth.   Unknown at Unknown  . guaiFENesin-codeine 100-10 MG/5ML syrup Take 5 mLs by mouth 3 (three) times daily as needed for cough. 120 mL 0   . ibuprofen (ADVIL,MOTRIN) 800 MG tablet Take 1 tablet (800 mg total) by mouth 3 (three) times daily. 30 tablet 0 Unknown at Unknown  . Multiple Vitamin (MULTIVITAMIN) tablet Take 1 tablet by mouth at bedtime.     Unknown at Unknown  . naproxen sodium (ALEVE) 220 MG tablet Take 220 mg by mouth 2 (two) times daily as needed. For pain    Unknown at Unknown  . Phenyleph-Doxyl-DM-Aspirin  (ALKA-SELTZER PLUS NIGHT COLD PO) Take 2 capsules by mouth at bedtime as needed. For cough/cold symptoms   Unknown at Unknown  . Probiotic Product (PROBIOTIC PO) Take 1 capsule by mouth at bedtime. Bifantis    Unknown at Unknown   Results for orders placed or performed during the hospital encounter of 08/31/14 (from the past 48 hour(s))  Pregnancy, urine POC     Status: Abnormal   Collection Time: 09/01/14 12:12 AM  Result Value Ref Range   Preg Test, Ur POSITIVE (A) NEGATIVE    Comment:        THE SENSITIVITY OF THIS METHODOLOGY IS >24 mIU/mL    Results for orders placed or performed during the hospital encounter of 08/31/14 (from the past 48 hour(s))  Urinalysis, Routine w reflex microscopic     Status: Abnormal   Collection Time: 09/01/14 12:01 AM  Result Value Ref Range   Color, Urine YELLOW YELLOW   APPearance CLEAR CLEAR   Specific Gravity, Urine 1.010 1.005 - 1.030   pH 6.5 5.0 - 8.0   Glucose, UA NEGATIVE NEGATIVE mg/dL   Hgb urine dipstick SMALL (A) NEGATIVE   Bilirubin Urine NEGATIVE NEGATIVE   Ketones, ur NEGATIVE NEGATIVE mg/dL   Protein, ur  NEGATIVE NEGATIVE mg/dL   Urobilinogen, UA 0.2 0.0 - 1.0 mg/dL   Nitrite NEGATIVE NEGATIVE   Leukocytes, UA NEGATIVE NEGATIVE  Urine microscopic-add on     Status: None   Collection Time: 09/01/14 12:01 AM  Result Value Ref Range   Squamous Epithelial / LPF RARE RARE   WBC, UA 0-2 <3 WBC/hpf   RBC / HPF 0-2 <3 RBC/hpf   Bacteria, UA RARE RARE  Pregnancy, urine POC     Status: Abnormal   Collection Time: 09/01/14 12:12 AM  Result Value Ref Range   Preg Test, Ur POSITIVE (A) NEGATIVE    Comment:        THE SENSITIVITY OF THIS METHODOLOGY IS >24 mIU/mL   CBC     Status: Abnormal   Collection Time: 09/01/14 12:36 AM  Result Value Ref Range   WBC 12.4 (H) 4.0 - 10.5 K/uL   RBC 3.95 3.87 - 5.11 MIL/uL   Hemoglobin 11.8 (L) 12.0 - 15.0 g/dL   HCT 40.9 (L) 81.1 - 91.4 %   MCV 85.1 78.0 - 100.0 fL   MCH 29.9 26.0 - 34.0 pg    MCHC 35.1 30.0 - 36.0 g/dL   RDW 78.2 95.6 - 21.3 %   Platelets 279 150 - 400 K/uL  ABO/Rh     Status: None (Preliminary result)   Collection Time: 09/01/14 12:36 AM  Result Value Ref Range   ABO/RH(D) O POS   hCG, quantitative, pregnancy     Status: Abnormal   Collection Time: 09/01/14 12:36 AM  Result Value Ref Range   hCG, Beta Chain, Quant, S 57303 (H) <5 mIU/mL    Comment:          GEST. AGE      CONC.  (mIU/mL)   <=1 WEEK        5 - 50     2 WEEKS       50 - 500     3 WEEKS       100 - 10,000     4 WEEKS     1,000 - 30,000     5 WEEKS     3,500 - 115,000   6-8 WEEKS     12,000 - 270,000    12 WEEKS     15,000 - 220,000        FEMALE AND NON-PREGNANT FEMALE:     LESS THAN 5 mIU/mL    US Ob Comp Less 14 Wks  09/01/2014   CLINICAL DATA:  Pregnant patient with heavy vaginal bleeding for few hours. Last menstrual period 07/17/2014  EXAM: OBSTETRIC <14 WK [place] AND TRANSVAGINAL OB US  TECHNIQUE: Both transabdominal and transvaginal ultrasound examinations were performed for complete evaluation of the gestation as well as the maternal uterus, adnexal regions, and pelvic cul-de-sac. Transvaginal technique was performed to assess early pregnancy.  COMPARISON:  None.  FINDINGS: Intrauterine gestational sac: Visualized/normal in shape.  Yolk sac:  Present.  Embryo:  Present.  Cardiac Activity: Present.  Heart Rate: 114-117  bpm  CRL:  6  mm   6 w   3 d                  [place] EDC: 04/24/2015  Maternal uterus/adnexae: No subchorionic hemorrhage. The right ovary measures 4.9 x 2.6 x 3.9 cm and contains a 3.7 cm simple cyst. Normal blood flow to the adjacent ovarian parenchyma. The left ovary is not visualized. There is no free fluid in the pelvis.  IMPRESSION: Single live intrauterine pregnancy estimated gestational age [redacted] weeks 3 days for estimated date of delivery 04/24/2015   Electronically Signed   By: Rubye Oaks M.D.   On: 09/01/2014 01:51   US Ob Transvaginal  09/01/2014   CLINICAL DATA:   Pregnant patient with heavy vaginal bleeding for few hours. Last menstrual period 07/17/2014  EXAM: OBSTETRIC <14 WK [place] AND TRANSVAGINAL OB US  TECHNIQUE: Both transabdominal and transvaginal ultrasound examinations were performed for complete evaluation of the gestation as well as the maternal uterus, adnexal regions, and pelvic cul-de-sac. Transvaginal technique was performed to assess early pregnancy.  COMPARISON:  None.  FINDINGS: Intrauterine gestational sac: Visualized/normal in shape.  Yolk sac:  Present.  Embryo:  Present.  Cardiac Activity: Present.  Heart Rate: 114-117  bpm  CRL:  6  mm   6 w   3 d                  [place] EDC: 04/24/2015  Maternal uterus/adnexae: No subchorionic hemorrhage. The right ovary measures 4.9 x 2.6 x 3.9 cm and contains a 3.7 cm simple cyst. Normal blood flow to the adjacent ovarian parenchyma. The left ovary is not visualized. There is no free fluid in the pelvis.  IMPRESSION: Single live intrauterine pregnancy estimated gestational age [redacted] weeks 3 days for estimated date of delivery 04/24/2015   Electronically Signed   By: Rubye Oaks M.D.   On: 09/01/2014 01:51    Review of Systems  Constitutional: Negative for fever and chills.  Gastrointestinal: Positive for constipation. Negative for nausea and vomiting.  Genitourinary: Negative for dysuria, urgency, frequency and hematuria.   Physical Exam   Blood pressure 140/75, pulse 102, temperature 99.1 F (37.3 C), resp. rate 18, last menstrual period 07/17/2014, SpO2 100 %.  Physical Exam  Constitutional: She is oriented to person, place, and time. She appears well-developed and well-nourished. No distress.  HENT:  Head: Normocephalic.  Eyes: Pupils are equal, round, and reactive to light.  Neck: Neck supple.  Cardiovascular: Normal rate and normal heart sounds.   Respiratory: Effort normal. No respiratory distress.  GI: Soft. She exhibits no distension. There is no tenderness.  Genitourinary:  Speculum  exam: Vagina - Small amount of dark red blood in vaginal canal  Cervix - + active bleeding; small amount  Bimanual exam: Cervix closed, no CMT  Uterus non tender, enlarged  Adnexa non tender, no masses bilaterally Chaperone present for exam.   Musculoskeletal: Normal range of motion.  Neurological: She is alert and oriented to person, place, and time.  Skin: Skin is warm. She is not diaphoretic.  Psychiatric: Her behavior is normal.    MAU Course  Procedures  None  MDM  UA  UPT  CBC  HCG  [place] to evaluate bleeding   O positive blood type   Discussed patient with Dr. Vincente Poli   Assessment and Plan   A:  Vaginal bleeding in pregnancy; first trimester SIUP @ [redacted]w[redacted]d with cardiac activity  P:  Discharge home in stable condition Bleeding precautions Pelvic rest Follow up with Dr. Vincente Poli as scheduled Return to MAU as needed, if symptoms worsen   Duane Lope, NP 09/01/2014 2:04 AM

## 2014-09-02 LAB — HIV ANTIBODY (ROUTINE TESTING W REFLEX): HIV Screen 4th Generation wRfx: NONREACTIVE

## 2014-09-30 MED ORDER — DEXTROSE 5 % IV SOLN
100.0000 mg | Freq: Once | INTRAVENOUS | Status: AC
  Administered 2014-10-01: 100 mg via INTRAVENOUS
  Filled 2014-09-30: qty 100

## 2014-09-30 MED ORDER — DOXYCYCLINE HYCLATE 100 MG IV SOLR
100.0000 mg | Freq: Once | INTRAVENOUS | Status: DC
  Filled 2014-09-30: qty 100

## 2014-09-30 NOTE — H&P (Signed)
[name] [name] is an 24 y.o. female with missed abortion at 10 weeks.  Pregnancy has been uncomplicated and patient has insignificant PMH.  Blood type O positive.  Declines expectant and medical management.  Pertinent Gynecological History: Menses: pregnant Bleeding: pregnant Contraception: none DES exposure: unknown Blood transfusions: none Sexually transmitted diseases: no past history Previous GYN Procedures: none  Last mammogram: n/a Date: n/a Last pap: normal Date: 06/2014 OB History: G1 P0   Menstrual History: Menarche age: n/a Patient's last menstrual period was 07/17/2014.    Past Medical History  Diagnosis Date  . No pertinent past medical history   . Vaginal Pap smear, abnormal     Past Surgical History  Procedure Laterality Date  . Mouth surgery    . Colposcopy vulva w/ biopsy      No family history on file.  Social History:  reports that she quit smoking about 5 weeks ago. She does not have any smokeless tobacco history on file. She reports that she does not drink alcohol or use illicit drugs.  Allergies: No Known Allergies  No prescriptions prior to admission    ROS  Last menstrual period 07/17/2014. Physical Exam  Constitutional: She is oriented to person, place, and time. She appears well-developed and well-nourished.  GI: Soft. There is no rebound and no guarding.  Neurological: She is alert and oriented to person, place, and time.  Skin: Skin is warm and dry.  Psychiatric: She has a normal mood and affect. Her behavior is normal.    No results found for this or any previous visit (from the past 24 hour(s)).  No results found.  Assessment/Plan: 23yo G1 with 10 week missed abortion -[facility] D&C -Patient is counseled about risk of bleeding, infection, scarring and damage to surrounding structures.  All questions were answered and the patient wishes to proceed.  Diania Co 09/30/2014, 1:41 PM

## 2014-10-01 ENCOUNTER — Encounter (HOSPITAL_COMMUNITY): Payer: Self-pay | Admitting: *Deleted

## 2014-10-01 ENCOUNTER — Ambulatory Visit (HOSPITAL_COMMUNITY): Payer: BLUE CROSS/BLUE SHIELD | Admitting: Anesthesiology

## 2014-10-01 ENCOUNTER — Ambulatory Visit (HOSPITAL_COMMUNITY)
Admission: RE | Admit: 2014-10-01 | Discharge: 2014-10-01 | Disposition: A | Discharge status: Home / Self Care | Payer: BLUE CROSS/BLUE SHIELD | Source: Ambulatory Visit | Attending: Obstetrics & Gynecology | Admitting: Obstetrics & Gynecology

## 2014-10-01 ENCOUNTER — Encounter (HOSPITAL_COMMUNITY)
Admission: RE | Disposition: A | Discharge status: Home / Self Care | Payer: Self-pay | Source: Ambulatory Visit | Attending: Obstetrics & Gynecology

## 2014-10-01 DIAGNOSIS — O021 Missed abortion: Secondary | ICD-10-CM | POA: Insufficient documentation

## 2014-10-01 DIAGNOSIS — Z87891 Personal history of nicotine dependence: Secondary | ICD-10-CM | POA: Insufficient documentation

## 2014-10-01 HISTORY — PX: DILATION AND EVACUATION: SHX1459

## 2014-10-01 SURGERY — DILATION AND EVACUATION, UTERUS
Anesthesia: Monitor Anesthesia Care

## 2014-10-01 MED ORDER — ACETAMINOPHEN 325 MG PO TABS
325.0000 mg | ORAL_TABLET | ORAL | Status: DC | PRN
  Administered 2014-10-01: 650 mg via ORAL

## 2014-10-01 MED ORDER — PROPOFOL INFUSION 10 MG/ML OPTIME
INTRAVENOUS | Status: DC | PRN
  Administered 2014-10-01: 100 ug/kg/min via INTRAVENOUS

## 2014-10-01 MED ORDER — ACETAMINOPHEN 325 MG PO TABS
ORAL_TABLET | ORAL | Status: AC
  Filled 2014-10-01: qty 2

## 2014-10-01 MED ORDER — FENTANYL CITRATE 0.05 MG/ML IJ SOLN
INTRAMUSCULAR | Status: AC
  Filled 2014-10-01: qty 2

## 2014-10-01 MED ORDER — OXYCODONE-ACETAMINOPHEN 7.5-325 MG PO TABS: 1.0000 | ORAL_TABLET | ORAL | Status: DC | PRN

## 2014-10-01 MED ORDER — DEXAMETHASONE SODIUM PHOSPHATE 4 MG/ML IJ SOLN
INTRAMUSCULAR | Status: DC | PRN
  Administered 2014-10-01: 4 mg via INTRAVENOUS

## 2014-10-01 MED ORDER — ONDANSETRON HCL 4 MG/2ML IJ SOLN
INTRAMUSCULAR | Status: AC
  Filled 2014-10-01: qty 2

## 2014-10-01 MED ORDER — PROPOFOL 10 MG/ML IV BOLUS
INTRAVENOUS | Status: AC
  Filled 2014-10-01: qty 40

## 2014-10-01 MED ORDER — IBUPROFEN 600 MG PO TABS: 600.0000 mg | ORAL_TABLET | Freq: Four times a day (QID) | ORAL | Status: DC | PRN

## 2014-10-01 MED ORDER — GLYCOPYRROLATE 0.2 MG/ML IJ SOLN
INTRAMUSCULAR | Status: AC
Start: 2014-10-01 — End: 2014-10-01
  Filled 2014-10-01: qty 1

## 2014-10-01 MED ORDER — ONDANSETRON HCL 4 MG/2ML IJ SOLN
INTRAMUSCULAR | Status: DC | PRN
  Administered 2014-10-01: 4 mg via INTRAVENOUS

## 2014-10-01 MED ORDER — LIDOCAINE HCL (CARDIAC) 20 MG/ML IV SOLN
INTRAVENOUS | Status: DC | PRN
  Administered 2014-10-01: 60 mg via INTRAVENOUS

## 2014-10-01 MED ORDER — DOXYCYCLINE HYCLATE 100 MG PO TABS: 200.0000 mg | ORAL_TABLET | Freq: Every day | ORAL | Status: DC

## 2014-10-01 MED ORDER — ACETAMINOPHEN 160 MG/5ML PO SOLN: 325.0000 mg | ORAL | Status: DC | PRN

## 2014-10-01 MED ORDER — LIDOCAINE HCL (CARDIAC) 20 MG/ML IV SOLN
INTRAVENOUS | Status: AC
  Filled 2014-10-01: qty 5

## 2014-10-01 MED ORDER — FENTANYL CITRATE 0.05 MG/ML IJ SOLN: 25.0000 ug | INTRAMUSCULAR | Status: DC | PRN

## 2014-10-01 MED ORDER — KETOROLAC TROMETHAMINE 30 MG/ML IJ SOLN
INTRAMUSCULAR | Status: DC | PRN
  Administered 2014-10-01: 30 mg via INTRAVENOUS

## 2014-10-01 MED ORDER — CHLOROPROCAINE HCL 1 % IJ SOLN
INTRAMUSCULAR | Status: AC
  Filled 2014-10-01: qty 30

## 2014-10-01 MED ORDER — DEXAMETHASONE SODIUM PHOSPHATE 4 MG/ML IJ SOLN
INTRAMUSCULAR | Status: AC
  Filled 2014-10-01: qty 1

## 2014-10-01 MED ORDER — FENTANYL CITRATE 0.05 MG/ML IJ SOLN
INTRAMUSCULAR | Status: DC | PRN
  Administered 2014-10-01: 50 ug via INTRAVENOUS
  Administered 2014-10-01: 100 ug via INTRAVENOUS

## 2014-10-01 MED ORDER — PROPOFOL 10 MG/ML IV BOLUS
INTRAVENOUS | Status: DC | PRN
  Administered 2014-10-01: 20 mg via INTRAVENOUS

## 2014-10-01 MED ORDER — KETOROLAC TROMETHAMINE 30 MG/ML IJ SOLN
INTRAMUSCULAR | Status: AC
  Filled 2014-10-01: qty 1

## 2014-10-01 MED ORDER — DOXYCYCLINE HYCLATE 100 MG PO TABS
ORAL_TABLET | ORAL | Status: AC
  Administered 2014-10-01: 200 mg
  Filled 2014-10-01: qty 2

## 2014-10-01 MED ORDER — GLYCOPYRROLATE 0.2 MG/ML IJ SOLN
INTRAMUSCULAR | Status: DC | PRN
  Administered 2014-10-01: 0.1 mg via INTRAVENOUS

## 2014-10-01 MED ORDER — SCOPOLAMINE 1 MG/3DAYS TD PT72: 1.0000 | MEDICATED_PATCH | Freq: Once | TRANSDERMAL | Status: DC

## 2014-10-01 MED ORDER — LACTATED RINGERS IV SOLN
INTRAVENOUS | Status: DC
Start: 2014-10-01 — End: 2014-10-01
  Administered 2014-10-01 (×2): via INTRAVENOUS

## 2014-10-01 MED ORDER — MIDAZOLAM HCL 2 MG/2ML IJ SOLN
INTRAMUSCULAR | Status: DC | PRN
  Administered 2014-10-01: 2 mg via INTRAVENOUS

## 2014-10-01 MED ORDER — MIDAZOLAM HCL 2 MG/2ML IJ SOLN
INTRAMUSCULAR | Status: AC
  Filled 2014-10-01: qty 2

## 2014-10-01 SURGICAL SUPPLY — 18 items
CATH ROBINSON RED A/P 16FR (CATHETERS) ×3 IMPLANT
CLOTH BEACON ORANGE TIMEOUT ST (SAFETY) ×3 IMPLANT
DECANTER SPIKE VIAL GLASS SM (MISCELLANEOUS) ×3 IMPLANT
GLOVE BIO SURGEON STRL SZ 6 (GLOVE) ×3 IMPLANT
GLOVE BIOGEL PI IND STRL 6 (GLOVE) ×2 IMPLANT
GLOVE BIOGEL PI INDICATOR 6 (GLOVE) ×4
GOWN STRL REUS W/TWL LRG LVL3 (GOWN DISPOSABLE) ×6 IMPLANT
KIT BERKELEY 1ST TRIMESTER 3/8 (MISCELLANEOUS) ×3 IMPLANT
NS IRRIG 1000ML POUR BTL (IV SOLUTION) ×3 IMPLANT
PACK VAGINAL MINOR WOMEN LF (CUSTOM PROCEDURE TRAY) ×3 IMPLANT
PAD OB MATERNITY 4.3X12.25 (PERSONAL CARE ITEMS) ×3 IMPLANT
PAD PREP 24X48 CUFFED NSTRL (MISCELLANEOUS) ×3 IMPLANT
SET BERKELEY SUCTION TUBING (SUCTIONS) ×3 IMPLANT
TOWEL OR 17X24 6PK STRL BLUE (TOWEL DISPOSABLE) ×6 IMPLANT
VACURETTE 10 RIGID CVD (CANNULA) ×2 IMPLANT
VACURETTE 7MM CVD STRL WRAP (CANNULA) IMPLANT
VACURETTE 8 RIGID CVD (CANNULA) IMPLANT
VACURETTE 9 RIGID CVD (CANNULA) IMPLANT

## 2014-10-01 NOTE — Op Note (Signed)
Patient: [name] [name],[name] [name] DOB: 03/07/1991 PREOPERATIVE DIAGNOSIS: Missed abortion at 10 weeks  POSTOPERATIVE DIAGNOSIS: The same  PROCEDURE: Suction Dilation and Curettage  SURGEON: Dr. Mitchel HonourMegan Guilianna Mckoy  INDICATIONS: 24 y.o. G1P0 here for scheduled surgery for suction D&C for missed abortion at 10 weeks. Risks of surgery were discussed with the patient including but not limited to: bleeding which may require transfusion; infection which may require antibiotics; injury to uterus or surrounding organs; intrauterine scarring which may impair future fertility; need for additional procedures including laparotomy or laparoscopy; and other postoperative/anesthesia complications. Written informed consent was obtained.  FINDINGS: A 10 week size uterus.  ANESTHESIA: general intubation  ESTIMATED BLOOD LOSS: 100cc SPECIMENS: Products of conception  COMPLICATIONS: None immediate.  PROCEDURE DETAILS: The patient received intravenous antibiotics while in the preoperative area. She was then taken to the operating room where conscious sedation was administered. After an adequate timeout was performed, she was placed in the dorsal lithotomy position and examined; then prepped and draped in the sterile manner. Her bladder was catheterized for an unmeasured amount of clear, yellow urine. A speculum was then placed in the patient's vagina.  A single tooth tenaculum was applied to the anterior lip of the cervix. The uteus was dilated manually with Pratt cervical dilators up to 31 [place]. Once the cervix was dilated, 10 mm suction curette was advanced and suction attached. Three suction curettage passes were performed which did remove tissue. A sharp curettage was then performed to obtain a scant amount of endometrial curettings and revealed a gritty texture circumferentially. The tenaculum was removed from the anterior lip of the cervix and the vaginal speculum was removed after noting good hemostasis. The patient tolerated the  procedure well and was taken to the recovery area awake, extubated and in stable condition.

## 2014-10-01 NOTE — Anesthesia Preprocedure Evaluation (Signed)
Anesthesia Evaluation  Patient identified by MRN, date of birth, ID band Patient awake    Reviewed: Allergy & Precautions, NPO status , Patient's Chart, lab work & pertinent test results  History of Anesthesia Complications Negative for: history of anesthetic complications  Airway Mallampati: I  TM Distance: >3 FB Neck ROM: Full    Dental  (+) Teeth Intact   Pulmonary neg pulmonary ROS, former smoker,  breath sounds clear to auscultation        Cardiovascular negative cardio ROS  Rhythm:Regular     Neuro/Psych negative neurological ROS  negative psych ROS   GI/Hepatic negative GI ROS, Neg liver ROS,   Endo/Other  negative endocrine ROS  Renal/GU negative Renal ROS     Musculoskeletal negative musculoskeletal ROS (+)   Abdominal   Peds  Hematology negative hematology ROS (+)   Anesthesia Other Findings   Reproductive/Obstetrics                             Anesthesia Physical Anesthesia Plan  ASA: I  Anesthesia Plan: MAC   Post-op Pain Management:    Induction: Intravenous  Airway Management Planned: Natural Airway  Additional Equipment: None  Intra-op Plan:   Post-operative Plan:   Informed Consent: I have reviewed the patients History and Physical, chart, labs and discussed the procedure including the risks, benefits and alternatives for the proposed anesthesia with the patient or authorized representative who has indicated his/her understanding and acceptance.   Dental advisory given  Plan Discussed with: CRNA and Surgeon  Anesthesia Plan Comments:         Anesthesia Quick Evaluation

## 2014-10-01 NOTE — Discharge Instructions (Signed)
NO IBUPROFEN PRODUCTS UNTIL 2 PM   Call MD for T>100.4, heavy vaginal bleeding, severe abdominal pain, or intractable nausea and/or vomiting.  Call office to schedule postop appointment in 2 weeks.  No driving while taking narcotics.  Pelvic rest x 4 weeks.    Dilation and Curettage or Vacuum Curettage, Care After    These instructions give you information on caring for yourself after your procedure. Your doctor may also give you more specific instructions. Call your doctor if you have any problems or questions after your procedure.  HOME CARE  Do not drive for 24 hours.  Wait 1 week before doing any activities that wear you out.  Take your temperature 2 times a day for 4 days. Write it down. Tell your doctor if you have a fever.  Do not stand for a long time.  Do not lift, push, or pull anything over 10 pounds (4.5 kilograms).  Limit stair climbing to once or twice a day.  Rest often.  Continue with your usual diet.  Drink enough fluids to keep your pee (urine) clear or pale yellow.  If you have a hard time pooping (constipation), you may:  Take a medicine to help you go poop (laxative) as told by your doctor.  Eat more fruit and bran.  Drink more fluids. Take showers, not baths, for as long as told by your doctor.  Do not swim or use a hot tub until your doctor says it is okay.  Have someone with you for 1-2 days after the procedure.  Do not douche, use tampons, or have sex (intercourse) for 2 weeks.  Only take medicines as told by your doctor. Do not take aspirin. It can cause bleeding.  Keep all doctor visits. GET HELP IF:  You have cramps or pain not helped by medicine.  You have new pain in the belly (abdomen).  You have a bad smelling fluid coming from your vagina.  You have a rash.  You have problems with any medicine. GET HELP RIGHT AWAY IF:  You start to bleed more than a regular period.  You have a fever.  You have chest pain.  You have trouble breathing.  You  feel dizzy or feel like passing out (fainting).  You pass out.  You have pain in the tops of your shoulders.  You have vaginal bleeding with or without clumps of blood (blood clots). MAKE SURE YOU:  Understand these instructions.  Will watch your condition.  Will get help right away if you are not doing well or get worse. Document Released: 03/17/2008 Document Revised: 06/13/2013 Document Reviewed: 01/05/2013  [facility] Patient Information 2015 [employer] [place]. This information is not intended to replace advice given to you by your health care provider. Make sure you discuss any questions you have with your health care provider.

## 2014-10-01 NOTE — Progress Notes (Signed)
No change to H&P. 

## 2014-10-01 NOTE — Transfer of Care (Signed)
Immediate Anesthesia Transfer of Care Note  Patient: [name] [name]  Procedure(s) Performed: Procedure(s): DILATATION AND EVACUATION (N/A)  Patient Location: PACU  Anesthesia Type:MAC  Level of Consciousness: awake, alert  and oriented  Airway & Oxygen Therapy: Patient Spontanous Breathing and Patient connected to nasal cannula oxygen  Post-op Assessment: Report given to RN and Post -op Vital signs reviewed and stable  Post vital signs: Reviewed and stable  Last Vitals:  Filed Vitals:   10/01/14 0631  BP: 117/74  Pulse: 107  Temp: 36.8 C  Resp: 18    Complications: No apparent anesthesia complications

## 2014-10-01 NOTE — Anesthesia Postprocedure Evaluation (Signed)
  Anesthesia Post-op Note  Patient: [name] [name]  Procedure(s) Performed: Procedure(s): DILATATION AND EVACUATION (N/A)  Patient Location: PACU  Anesthesia Type:MAC  Level of Consciousness: awake  Airway and Oxygen Therapy: Patient Spontanous Breathing  Post-op Pain: mild  Post-op Assessment: Post-op Vital signs reviewed, Patient's Cardiovascular Status Stable, Respiratory Function Stable, Patent Airway, No signs of Nausea or vomiting and Pain level controlled  Post-op Vital Signs: Reviewed and stable  Last Vitals:  Filed Vitals:   10/01/14 0915  BP: 110/68  Pulse: 82  Temp:   Resp: 21    Complications: No apparent anesthesia complications

## 2014-10-02 ENCOUNTER — Encounter (HOSPITAL_COMMUNITY): Payer: Self-pay | Admitting: Obstetrics & Gynecology

## 2014-10-06 ENCOUNTER — Encounter (HOSPITAL_COMMUNITY): Payer: Self-pay | Admitting: *Deleted

## 2014-10-06 ENCOUNTER — Inpatient Hospital Stay (HOSPITAL_COMMUNITY)
Admission: AD | Admit: 2014-10-06 | Discharge: 2014-10-06 | Disposition: A | Discharge status: Home / Self Care | Payer: BLUE CROSS/BLUE SHIELD | Source: Ambulatory Visit | Attending: Obstetrics and Gynecology | Admitting: Obstetrics and Gynecology

## 2014-10-06 DIAGNOSIS — Z87891 Personal history of nicotine dependence: Secondary | ICD-10-CM | POA: Diagnosis not present

## 2014-10-06 DIAGNOSIS — N939 Abnormal uterine and vaginal bleeding, unspecified: Secondary | ICD-10-CM

## 2014-10-06 DIAGNOSIS — O036 Delayed or excessive hemorrhage following complete or unspecified spontaneous abortion: Secondary | ICD-10-CM | POA: Insufficient documentation

## 2014-10-06 LAB — CBC
HCT: 31.9 % — ABNORMAL LOW (ref 36.0–46.0)
Hemoglobin: 10.8 g/dL — ABNORMAL LOW (ref 12.0–15.0)
MCH: 29.5 pg (ref 26.0–34.0)
MCHC: 33.9 g/dL (ref 30.0–36.0)
MCV: 87.2 fL (ref 78.0–100.0)
Platelets: 298 10*3/uL (ref 150–400)
RBC: 3.66 MIL/uL — ABNORMAL LOW (ref 3.87–5.11)
RDW: 12.7 % (ref 11.5–15.5)
WBC: 10 10*3/uL (ref 4.0–10.5)

## 2014-10-06 NOTE — MAU Provider Note (Signed)
History     CSN: [id number]  Arrival date and time: 10/06/14 1244   None     Chief Complaint  Patient presents with  . Post-op Problem   HPI 24 y.o. G1P0010 with c/o heavy vaginal bleeding 5 days s/p D&E for 11 week fetal demise. States she had no bleeding at all on Thursday, then one episode of heavy bleeding with clots yesterday. This morning she woke up around 11 AM, went to bathroom and had soaked through regular pad, underwear and shorts. In the following 2 hours, she nearly soaked 2 regular pads. Some cramping this morning that was relieved w/ percocet. No fever or severe pain.   Past Medical History  Diagnosis Date  . No pertinent past medical history   . Vaginal Pap smear, abnormal     Past Surgical History  Procedure Laterality Date  . Mouth surgery    . Colposcopy vulva w/ biopsy    . Dilation and evacuation N/A 10/01/2014    Procedure: DILATATION AND EVACUATION;  Surgeon: Mitchel Honour, DO;  Location: [facility];  Service: Gynecology;  Laterality: N/A;    History reviewed. No pertinent family history.  History  Substance Use Topics  . Smoking status: Former Smoker    Quit date: 08/21/2014  . Smokeless tobacco: Not on file  . Alcohol Use: No    Allergies: No Known Allergies  Prescriptions prior to admission  Medication Sig Dispense Refill Last Dose  . acetaminophen (TYLENOL) 500 MG tablet Take 1,000 mg by mouth every 6 (six) hours as needed for mild pain or headache.     Marland Kitchen amoxicillin (AMOXIL) 500 MG capsule Take 1 capsule (500 mg total) by mouth 3 (three) times daily. (Patient not taking: Reported on 09/28/2014) 30 capsule 0 Unknown at Unknown  . ibuprofen (ADVIL,MOTRIN) 600 MG tablet Take 1 tablet (600 mg total) by mouth every 6 (six) hours as needed. 30 tablet 0   . oxyCODONE-acetaminophen (PERCOCET) 7.5-325 MG per tablet Take 1 tablet by mouth every 4 (four) hours as needed for pain. 20 tablet 0     Review of Systems  Constitutional: Negative.   Respiratory:  Negative.   Cardiovascular: Negative.   Gastrointestinal: Negative for nausea, vomiting, abdominal pain, diarrhea and constipation.  Genitourinary: Negative for dysuria, urgency, frequency, hematuria and flank pain.       + bleeding   Musculoskeletal: Negative.   Neurological: Negative.   Psychiatric/Behavioral: Negative.    Physical Exam   Blood pressure 128/87, pulse 88, resp. rate 18, last menstrual period 07/17/2014.  Physical Exam  Nursing note and vitals reviewed. Constitutional: She is oriented to person, place, and time. She appears well-developed and well-nourished. No distress.  Cardiovascular: Normal rate.   Respiratory: Effort normal.  GI: Soft. There is no tenderness.  Genitourinary: There is no rash, tenderness or lesion on the right labia. There is no rash, tenderness or lesion on the left labia. Uterus is not enlarged and not tender. Cervix exhibits no motion tenderness and no discharge. Right adnexum displays no mass, no tenderness and no fullness. Left adnexum displays no mass, no tenderness and no fullness. There is bleeding (moderate) in the vagina. No vaginal discharge found.  Musculoskeletal: Normal range of motion.  Neurological: She is alert and oriented to person, place, and time.  Skin: Skin is warm and dry.  Psychiatric: She has a normal mood and affect.    MAU Course  Procedures Results for orders placed or performed during the hospital encounter of 10/06/14 (from  the past 24 hour(s))  CBC     Status: Abnormal   Collection Time: 10/06/14  1:35 PM  Result Value Ref Range   WBC 10.0 4.0 - 10.5 K/uL   RBC 3.66 (L) 3.87 - 5.11 MIL/uL   Hemoglobin 10.8 (L) 12.0 - 15.0 g/dL   HCT 16.131.9 (L) 09.636.0 - 04.546.0 %   MCV 87.2 78.0 - 100.0 fL   MCH 29.5 26.0 - 34.0 pg   MCHC 33.9 30.0 - 36.0 g/dL   RDW 40.912.7 81.111.5 - 91.415.5 %   Platelets 298 150 - 400 K/uL     Assessment and Plan   1. Vaginal bleeding   WNL s/p D&E, precautions rev'd, f/u as scheduled      Medication List    STOP taking these medications        amoxicillin 500 MG capsule  Commonly known as:  AMOXIL      TAKE these medications        ibuprofen 600 MG tablet  Commonly known as:  ADVIL,MOTRIN  Take 1 tablet (600 mg total) by mouth every 6 (six) hours as needed.     oxyCODONE-acetaminophen 7.5-325 MG per tablet  Commonly known as:  PERCOCET  Take 1 tablet by mouth every 4 (four) hours as needed for pain.            Follow-up Information    Follow up with Juluis MireMCCOMB,JOHN S, MD.   Specialty:  Obstetrics and Gynecology   Why:  as scheduled   Contact information:   [address] [place] [address] [phone]         [facility] CampusFRAZIER,Anu Stagner 10/06/2014, 1:26 PM

## 2014-10-06 NOTE — MAU Note (Signed)
Pt presents to MAU with complaints of heavy vaginal bleeding. PT had D&E on Monday for 11 week demise. Abdominal cramping.

## 2014-10-06 NOTE — Discharge Instructions (Signed)
Follow up with your provider as scheduled or sooner as needed. Return to MAU with excessive bleeding (soaking more than 2 super pads/hour for more than 2 hours in a row) or with severe pain, fever, foul smelling discharge or other concerning symptoms.

## 2015-04-09 LAB — OB RESULTS CONSOLE HIV ANTIBODY (ROUTINE TESTING): HIV: NONREACTIVE

## 2015-04-09 LAB — OB RESULTS CONSOLE RUBELLA ANTIBODY, IGM: Rubella: IMMUNE

## 2015-04-09 LAB — OB RESULTS CONSOLE GC/CHLAMYDIA
CHLAMYDIA, DNA PROBE: NEGATIVE
Gonorrhea: NEGATIVE

## 2015-04-09 LAB — OB RESULTS CONSOLE RPR: RPR: NONREACTIVE

## 2015-04-09 LAB — OB RESULTS CONSOLE ANTIBODY SCREEN: ANTIBODY SCREEN: NEGATIVE

## 2015-04-09 LAB — OB RESULTS CONSOLE HEPATITIS B SURFACE ANTIGEN: Hepatitis B Surface Ag: NEGATIVE

## 2015-06-07 ENCOUNTER — Other Ambulatory Visit (HOSPITAL_COMMUNITY): Payer: Self-pay | Admitting: Obstetrics & Gynecology

## 2015-06-07 DIAGNOSIS — Z3689 Encounter for other specified antenatal screening: Secondary | ICD-10-CM

## 2015-06-20 ENCOUNTER — Other Ambulatory Visit (HOSPITAL_COMMUNITY): Payer: Self-pay | Admitting: Obstetrics & Gynecology

## 2015-06-20 ENCOUNTER — Ambulatory Visit (HOSPITAL_COMMUNITY)
Admission: RE | Admit: 2015-06-20 | Discharge: 2015-06-20 | Disposition: A | Discharge status: Home / Self Care | Payer: Medicaid Other | Source: Ambulatory Visit | Attending: Obstetrics & Gynecology | Admitting: Obstetrics & Gynecology

## 2015-06-20 ENCOUNTER — Encounter (HOSPITAL_COMMUNITY): Payer: Self-pay

## 2015-06-20 DIAGNOSIS — O358XX0 Maternal care for other (suspected) fetal abnormality and damage, not applicable or unspecified: Secondary | ICD-10-CM | POA: Insufficient documentation

## 2015-06-20 DIAGNOSIS — Z36 Encounter for antenatal screening of mother: Secondary | ICD-10-CM | POA: Diagnosis not present

## 2015-06-20 DIAGNOSIS — Z3689 Encounter for other specified antenatal screening: Secondary | ICD-10-CM

## 2015-06-20 DIAGNOSIS — Z3A21 21 weeks gestation of pregnancy: Secondary | ICD-10-CM | POA: Insufficient documentation

## 2015-06-25 ENCOUNTER — Encounter (HOSPITAL_COMMUNITY): Payer: Self-pay

## 2015-06-25 ENCOUNTER — Other Ambulatory Visit (HOSPITAL_COMMUNITY): Payer: Self-pay

## 2015-10-02 LAB — OB RESULTS CONSOLE GBS: GBS: NEGATIVE

## 2015-10-30 ENCOUNTER — Inpatient Hospital Stay (HOSPITAL_COMMUNITY)
Admission: AD | Admit: 2015-10-30 | Discharge: 2015-10-30 | Disposition: A | Discharge status: Home / Self Care | Payer: Medicaid Other | Source: Ambulatory Visit | Attending: Obstetrics and Gynecology | Admitting: Obstetrics and Gynecology

## 2015-10-30 ENCOUNTER — Encounter (HOSPITAL_COMMUNITY): Payer: Self-pay

## 2015-10-30 LAB — URINALYSIS, ROUTINE W REFLEX MICROSCOPIC
Bilirubin Urine: NEGATIVE
GLUCOSE, UA: NEGATIVE mg/dL
HGB URINE DIPSTICK: NEGATIVE
KETONES UR: NEGATIVE mg/dL
Leukocytes, UA: NEGATIVE
Nitrite: NEGATIVE
PROTEIN: NEGATIVE mg/dL
Specific Gravity, Urine: 1.025 (ref 1.005–1.030)
pH: 6 (ref 5.0–8.0)

## 2015-10-30 NOTE — MAU Note (Signed)
Pt states she has been having trickles of fluid from her vagina since yesterday about 1400.  Not wearing a pad.  Had had moistened panties x 2 today.  Clear fluid without a smell.  Denies vaginal bleeding.  Good fetal movement.

## 2015-10-30 NOTE — Discharge Instructions (Signed)
Fetal Movement Counts  Patient Name: __________________________________________________ Patient Due Date: ____________________  Performing a fetal movement count is highly recommended in high-risk pregnancies, but it is good for every pregnant woman to do. Your health care provider may ask you to start counting fetal movements at 28 weeks of the pregnancy. Fetal movements often increase:  · After eating a full meal.  · After physical activity.  · After eating or drinking something sweet or cold.  · At rest.  Pay attention to when you feel the baby is most active. This will help you notice a pattern of your baby's sleep and wake cycles and what factors contribute to an increase in fetal movement. It is important to perform a fetal movement count at the same time each day when your baby is normally most active.   HOW TO COUNT FETAL MOVEMENTS  1. Find a quiet and comfortable area to sit or lie down on your left side. Lying on your left side provides the best blood and oxygen circulation to your baby.  2. Write down the day and time on a sheet of paper or in a journal.  3. Start counting kicks, flutters, swishes, rolls, or jabs in a 2-hour period. You should feel at least 10 movements within 2 hours.  4. If you do not feel 10 movements in 2 hours, wait 2-3 hours and count again. Look for a change in the pattern or not enough counts in 2 hours.  SEEK MEDICAL CARE IF:  · You feel less than 10 counts in 2 hours, tried twice.  · There is no movement in over an hour.  · The pattern is changing or taking longer each day to reach 10 counts in 2 hours.  · You feel the baby is not moving as he or she usually does.  Date: ____________ Movements: ____________ Start time: ____________ Finish time: ____________   Date: ____________ Movements: ____________ Start time: ____________ Finish time: ____________  Date: ____________ Movements: ____________ Start time: ____________ Finish time: ____________  Date: ____________ Movements:  ____________ Start time: ____________ Finish time: ____________  Date: ____________ Movements: ____________ Start time: ____________ Finish time: ____________  Date: ____________ Movements: ____________ Start time: ____________ Finish time: ____________  Date: ____________ Movements: ____________ Start time: ____________ Finish time: ____________  Date: ____________ Movements: ____________ Start time: ____________ Finish time: ____________   Date: ____________ Movements: ____________ Start time: ____________ Finish time: ____________  Date: ____________ Movements: ____________ Start time: ____________ Finish time: ____________  Date: ____________ Movements: ____________ Start time: ____________ Finish time: ____________  Date: ____________ Movements: ____________ Start time: ____________ Finish time: ____________  Date: ____________ Movements: ____________ Start time: ____________ Finish time: ____________  Date: ____________ Movements: ____________ Start time: ____________ Finish time: ____________  Date: ____________ Movements: ____________ Start time: ____________ Finish time: ____________   Date: ____________ Movements: ____________ Start time: ____________ Finish time: ____________  Date: ____________ Movements: ____________ Start time: ____________ Finish time: ____________  Date: ____________ Movements: ____________ Start time: ____________ Finish time: ____________  Date: ____________ Movements: ____________ Start time: ____________ Finish time: ____________  Date: ____________ Movements: ____________ Start time: ____________ Finish time: ____________  Date: ____________ Movements: ____________ Start time: ____________ Finish time: ____________  Date: ____________ Movements: ____________ Start time: ____________ Finish time: ____________   Date: ____________ Movements: ____________ Start time: ____________ Finish time: ____________  Date: ____________ Movements: ____________ Start time: ____________ Finish  time: ____________  Date: ____________ Movements: ____________ Start time: ____________ Finish time: ____________  Date: ____________ Movements: ____________ Start time:   ____________ Finish time: ____________  Date: ____________ Movements: ____________ Start time: ____________ Finish time: ____________  Date: ____________ Movements: ____________ Start time: ____________ Finish time: ____________  Date: ____________ Movements: ____________ Start time: ____________ Finish time: ____________   Date: ____________ Movements: ____________ Start time: ____________ Finish time: ____________  Date: ____________ Movements: ____________ Start time: ____________ Finish time: ____________  Date: ____________ Movements: ____________ Start time: ____________ Finish time: ____________  Date: ____________ Movements: ____________ Start time: ____________ Finish time: ____________  Date: ____________ Movements: ____________ Start time: ____________ Finish time: ____________  Date: ____________ Movements: ____________ Start time: ____________ Finish time: ____________  Date: ____________ Movements: ____________ Start time: ____________ Finish time: ____________   Date: ____________ Movements: ____________ Start time: ____________ Finish time: ____________  Date: ____________ Movements: ____________ Start time: ____________ Finish time: ____________  Date: ____________ Movements: ____________ Start time: ____________ Finish time: ____________  Date: ____________ Movements: ____________ Start time: ____________ Finish time: ____________  Date: ____________ Movements: ____________ Start time: ____________ Finish time: ____________  Date: ____________ Movements: ____________ Start time: ____________ Finish time: ____________  Date: ____________ Movements: ____________ Start time: ____________ Finish time: ____________   Date: ____________ Movements: ____________ Start time: ____________ Finish time: ____________  Date: ____________  Movements: ____________ Start time: ____________ Finish time: ____________  Date: ____________ Movements: ____________ Start time: ____________ Finish time: ____________  Date: ____________ Movements: ____________ Start time: ____________ Finish time: ____________  Date: ____________ Movements: ____________ Start time: ____________ Finish time: ____________  Date: ____________ Movements: ____________ Start time: ____________ Finish time: ____________  Date: ____________ Movements: ____________ Start time: ____________ Finish time: ____________   Date: ____________ Movements: ____________ Start time: ____________ Finish time: ____________  Date: ____________ Movements: ____________ Start time: ____________ Finish time: ____________  Date: ____________ Movements: ____________ Start time: ____________ Finish time: ____________  Date: ____________ Movements: ____________ Start time: ____________ Finish time: ____________  Date: ____________ Movements: ____________ Start time: ____________ Finish time: ____________  Date: ____________ Movements: ____________ Start time: ____________ Finish time: ____________     This information is not intended to replace advice given to you by your health care provider. Make sure you discuss any questions you have with your health care provider.     Document Released: 07/08/2006 Document Revised: 06/29/2014 Document Reviewed: 04/04/2012  [employer] Interactive Patient Education ©2016 [employer]

## 2015-10-31 ENCOUNTER — Inpatient Hospital Stay (HOSPITAL_COMMUNITY)
Admission: AD | Admit: 2015-10-31 | Discharge: 2015-11-03 | DRG: 766 | Disposition: A | Discharge status: Home / Self Care | Payer: Medicaid Other | Source: Ambulatory Visit | Attending: Obstetrics and Gynecology | Admitting: Obstetrics and Gynecology

## 2015-10-31 ENCOUNTER — Encounter (HOSPITAL_COMMUNITY): Payer: Self-pay

## 2015-10-31 DIAGNOSIS — Z3490 Encounter for supervision of normal pregnancy, unspecified, unspecified trimester: Secondary | ICD-10-CM

## 2015-10-31 DIAGNOSIS — Z833 Family history of diabetes mellitus: Secondary | ICD-10-CM

## 2015-10-31 DIAGNOSIS — O134 Gestational [pregnancy-induced] hypertension without significant proteinuria, complicating childbirth: Secondary | ICD-10-CM | POA: Diagnosis present

## 2015-10-31 DIAGNOSIS — Z87891 Personal history of nicotine dependence: Secondary | ICD-10-CM | POA: Diagnosis not present

## 2015-10-31 DIAGNOSIS — Z3A40 40 weeks gestation of pregnancy: Secondary | ICD-10-CM | POA: Diagnosis not present

## 2015-10-31 DIAGNOSIS — O133 Gestational [pregnancy-induced] hypertension without significant proteinuria, third trimester: Secondary | ICD-10-CM

## 2015-10-31 DIAGNOSIS — Z8249 Family history of ischemic heart disease and other diseases of the circulatory system: Secondary | ICD-10-CM | POA: Diagnosis not present

## 2015-10-31 LAB — CBC
HCT: 35.6 % — ABNORMAL LOW (ref 36.0–46.0)
Hemoglobin: 12.1 g/dL (ref 12.0–15.0)
MCH: 29.5 pg (ref 26.0–34.0)
MCHC: 34 g/dL (ref 30.0–36.0)
MCV: 86.8 fL (ref 78.0–100.0)
PLATELETS: 218 10*3/uL (ref 150–400)
RBC: 4.1 MIL/uL (ref 3.87–5.11)
RDW: 13.4 % (ref 11.5–15.5)
WBC: 12.7 10*3/uL — AB (ref 4.0–10.5)

## 2015-10-31 LAB — TYPE AND SCREEN
ABO/RH(D): O POS
Antibody Screen: NEGATIVE

## 2015-10-31 LAB — COMPREHENSIVE METABOLIC PANEL
ALT: 16 U/L (ref 14–54)
ANION GAP: 12 (ref 5–15)
AST: 24 U/L (ref 15–41)
Albumin: 3.5 g/dL (ref 3.5–5.0)
Alkaline Phosphatase: 136 U/L — ABNORMAL HIGH (ref 38–126)
BILIRUBIN TOTAL: 0.3 mg/dL (ref 0.3–1.2)
BUN: 15 mg/dL (ref 6–20)
CHLORIDE: 106 mmol/L (ref 101–111)
CO2: 19 mmol/L — ABNORMAL LOW (ref 22–32)
Calcium: 9.2 mg/dL (ref 8.9–10.3)
Creatinine, Ser: 0.55 mg/dL (ref 0.44–1.00)
GFR calc Af Amer: >60 mL/min (ref >60)
GFR calc non Af Amer: >60 mL/min (ref >60)
Glucose, Bld: 100 mg/dL — ABNORMAL HIGH (ref 65–99)
POTASSIUM: 3.6 mmol/L (ref 3.5–5.1)
Sodium: 137 mmol/L (ref 135–145)
TOTAL PROTEIN: 7.2 g/dL (ref 6.5–8.1)

## 2015-10-31 LAB — URIC ACID: Uric Acid, Serum: 7.3 mg/dL — ABNORMAL HIGH (ref 2.3–6.6)

## 2015-10-31 LAB — PROTEIN / CREATININE RATIO, URINE
CREATININE, URINE: 204 mg/dL
PROTEIN CREATININE RATIO: 0.19 mg/mg{creat} — AB (ref 0.00–0.15)
TOTAL PROTEIN, URINE: 39 mg/dL

## 2015-10-31 LAB — LACTATE DEHYDROGENASE: LDH: 117 U/L (ref 98–192)

## 2015-10-31 MED ORDER — TERBUTALINE SULFATE 1 MG/ML IJ SOLN: 0.2500 mg | Freq: Once | INTRAMUSCULAR | Status: DC | PRN

## 2015-10-31 MED ORDER — BUTORPHANOL TARTRATE 1 MG/ML IJ SOLN
1.0000 mg | Freq: Once | INTRAMUSCULAR | Status: AC
  Administered 2015-10-31: 1 mg via INTRAVENOUS
  Filled 2015-10-31: qty 1

## 2015-10-31 MED ORDER — ACETAMINOPHEN 325 MG PO TABS: 650.0000 mg | ORAL_TABLET | ORAL | Status: DC | PRN

## 2015-10-31 MED ORDER — OXYCODONE-ACETAMINOPHEN 5-325 MG PO TABS: 2.0000 | ORAL_TABLET | ORAL | Status: DC | PRN

## 2015-10-31 MED ORDER — FLEET ENEMA 7-19 GM/118ML RE ENEM: 1.0000 | ENEMA | RECTAL | Status: DC | PRN

## 2015-10-31 MED ORDER — DIPHENHYDRAMINE HCL 50 MG/ML IJ SOLN: 12.5000 mg | INTRAMUSCULAR | Status: DC | PRN

## 2015-10-31 MED ORDER — OXYTOCIN 40 UNITS IN LACTATED RINGERS INFUSION - SIMPLE MED
1.0000 m[IU]/min | INTRAVENOUS | Status: DC
  Administered 2015-10-31: 1 m[IU]/min via INTRAVENOUS
  Filled 2015-10-31: qty 1000

## 2015-10-31 MED ORDER — FENTANYL 2.5 MCG/ML BUPIVACAINE 1/10 % EPIDURAL INFUSION (WH - ANES)
14.0000 mL/h | INTRAMUSCULAR | Status: DC | PRN
  Administered 2015-11-01: 12 mL/h via EPIDURAL
  Administered 2015-11-01: 14 mL/h via EPIDURAL
  Administered 2015-11-01: 12 mL/h via EPIDURAL
  Administered 2015-11-01: 14 mL/h via EPIDURAL
  Filled 2015-10-31 (×2): qty 125

## 2015-10-31 MED ORDER — EPHEDRINE 5 MG/ML INJ: 10.0000 mg | INTRAVENOUS | Status: DC | PRN

## 2015-10-31 MED ORDER — LACTATED RINGERS IV SOLN
INTRAVENOUS | Status: DC
  Administered 2015-10-31 – 2015-11-01 (×5): via INTRAVENOUS

## 2015-10-31 MED ORDER — LIDOCAINE HCL (PF) 1 % IJ SOLN: 30.0000 mL | INTRAMUSCULAR | Status: DC | PRN

## 2015-10-31 MED ORDER — PHENYLEPHRINE 40 MCG/ML (10ML) SYRINGE FOR IV PUSH (FOR BLOOD PRESSURE SUPPORT)
80.0000 ug | PREFILLED_SYRINGE | INTRAVENOUS | Status: DC | PRN
  Filled 2015-10-31: qty 10

## 2015-10-31 MED ORDER — MISOPROSTOL 25 MCG QUARTER TABLET
25.0000 ug | ORAL_TABLET | ORAL | Status: DC | PRN
  Administered 2015-10-31: 25 ug via VAGINAL
  Filled 2015-10-31: qty 0.25

## 2015-10-31 MED ORDER — LACTATED RINGERS IV SOLN: 500.0000 mL | Freq: Once | INTRAVENOUS | Status: DC

## 2015-10-31 MED ORDER — CITRIC ACID-SODIUM CITRATE 334-500 MG/5ML PO SOLN
30.0000 mL | ORAL | Status: DC | PRN
  Administered 2015-11-01: 30 mL via ORAL
  Filled 2015-10-31: qty 15

## 2015-10-31 MED ORDER — OXYCODONE-ACETAMINOPHEN 5-325 MG PO TABS: 1.0000 | ORAL_TABLET | ORAL | Status: DC | PRN

## 2015-10-31 MED ORDER — ONDANSETRON HCL 4 MG/2ML IJ SOLN
4.0000 mg | Freq: Four times a day (QID) | INTRAMUSCULAR | Status: DC | PRN
  Administered 2015-11-01: 4 mg via INTRAVENOUS
  Filled 2015-10-31: qty 2

## 2015-10-31 MED ORDER — PHENYLEPHRINE 40 MCG/ML (10ML) SYRINGE FOR IV PUSH (FOR BLOOD PRESSURE SUPPORT): 80.0000 ug | PREFILLED_SYRINGE | INTRAVENOUS | Status: DC | PRN

## 2015-10-31 MED ORDER — LACTATED RINGERS IV SOLN: 500.0000 mL | INTRAVENOUS | Status: DC | PRN

## 2015-10-31 MED ORDER — ZOLPIDEM TARTRATE 5 MG PO TABS: 5.0000 mg | ORAL_TABLET | Freq: Every evening | ORAL | Status: DC | PRN

## 2015-10-31 MED ORDER — OXYTOCIN 40 UNITS IN LACTATED RINGERS INFUSION - SIMPLE MED: 2.5000 [IU]/h | INTRAVENOUS | Status: DC

## 2015-10-31 MED ORDER — OXYTOCIN BOLUS FROM INFUSION: 500.0000 mL | INTRAVENOUS | Status: DC

## 2015-10-31 NOTE — H&P (Signed)
Lambert ModyDayna S Scott  DICTATION # [id number] CSN# [id number]   Meriel PicaHOLLAND,Myliyah Rebuck M, MD 10/31/2015 2:36 PM

## 2015-10-31 NOTE — MAU Provider Note (Signed)
Chief Complaint:  Labor Eval   First Provider Initiated Contact with Patient 10/31/15 1209      HPI: [name] [name] is a 25 y.o. G2P0010 at 69w0dwho presents to maternity admissions reporting contractions intermittently since yesterday, more frequent this morning than now since arriving in MAU.  She reports leaking clear fluid, wetting her underwear but not requiring a pad or pantyliner x 3 days.  She was evaluated for SROM in MAU yesterday and testing was negative.  She also reports h/a off and on x 2 days with some visual changes, seeing spots occasionally, starting today.  She has not tried any treatments including medications for her symptoms.   She reports good fetal movement, denies vaginal bleeding, vaginal itching/burning, urinary symptoms, h/a, dizziness, n/v, or fever/chills.    HPI  Past Medical History: Past Medical History  Diagnosis Date  . No pertinent past medical history   . Vaginal Pap smear, abnormal     Past obstetric history: OB History  Gravida Para Term Preterm AB SAB TAB Ectopic Multiple Living  2 0 0 0 1 1 0 0 0 0     # Outcome Date GA Lbr Len/2nd Weight Sex Delivery Anes PTL Lv  2 Current           1 SAB               Past Surgical History: Past Surgical History  Procedure Laterality Date  . Mouth surgery    . Colposcopy vulva w/ biopsy    . Dilation and evacuation N/A 10/01/2014    Procedure: DILATATION AND EVACUATION;  Surgeon: Mitchel Honour, DO;  Location: [facility];  Service: Gynecology;  Laterality: N/A;    Family History: Family History  Problem Relation Age of Onset  . Diabetes Maternal Grandfather   . Diabetes Paternal Grandmother   . Heart disease Paternal Grandmother     Social History: Social History  Substance Use Topics  . Smoking status: Former Smoker    Quit date: 08/21/2014  . Smokeless tobacco: Never Used  . Alcohol Use: No    Allergies: No Known Allergies  Meds:  Prescriptions prior to admission  Medication Sig Dispense  Refill Last Dose  . ibuprofen (ADVIL,MOTRIN) 600 MG tablet Take 1 tablet (600 mg total) by mouth every 6 (six) hours as needed. (Patient not taking: Reported on 10/30/2015) 30 tablet 0 Past Week at Unknown time  . oxyCODONE-acetaminophen (PERCOCET) 7.5-325 MG per tablet Take 1 tablet by mouth every 4 (four) hours as needed for pain. (Patient not taking: Reported on 10/30/2015) 20 tablet 0 10/06/2014 at Unknown time  . Prenatal Vit-Fe Fumarate-FA (PRENATAL MULTIVITAMIN) TABS tablet Take 1 tablet by mouth daily at 12 noon.   10/29/2015 at Unknown time    ROS:  Review of Systems  Constitutional: Negative for fever, chills and fatigue.  Eyes: Positive for visual disturbance.  Respiratory: Negative for shortness of breath.   Cardiovascular: Negative for chest pain.  Gastrointestinal: Negative for nausea, vomiting and abdominal pain.  Genitourinary: Negative for dysuria, flank pain, vaginal bleeding, vaginal discharge, difficulty urinating, vaginal pain and pelvic pain.  Neurological: Positive for headaches. Negative for dizziness.  Psychiatric/Behavioral: Negative.      I have reviewed patient's Past Medical Hx, Surgical Hx, Family Hx, Social Hx, medications and allergies.   Physical Exam   Patient Vitals for the past 24 hrs:  BP Temp src Pulse Resp SpO2 Height Weight  10/31/15 1412 129/80 mmHg - 87 - - - -  10/31/15  1409 - - 90 - 96 % - -  10/31/15 1404 - - 92 - 97 % - -  10/31/15 1401 131/93 mmHg - 96 - - - -  10/31/15 1359 - - 88 - 96 % - -  10/31/15 1354 - - 93 - 96 % - -  10/31/15 1352 128/87 mmHg - 95 - - - -  10/31/15 1349 - - 90 - 98 % - -  10/31/15 1345 - - 65 - - - -  10/31/15 1344 (!) 144/105 mmHg - 88 - 96 % - -  10/31/15 1342 - - 91 - 97 % - -  10/31/15 1334 - - 89 - 97 % - -  10/31/15 1331 127/83 mmHg - 93 - - - -  10/31/15 1329 - - 91 - 98 % - -  10/31/15 1324 - - 102 - 97 % - -  10/31/15 1321 128/74 mmHg - 97 - - - -  10/31/15 1320 - - 106 20 97 % - -  10/31/15 1319 -  - 90 - 94 % - -  10/31/15 1314 - - 89 - 94 % - -  10/31/15 1311 135/81 mmHg - 88 - - - -  10/31/15 1304 - - 90 - 94 % - -  10/31/15 1301 127/84 mmHg - 97 - - - -  10/31/15 1259 - - 94 - 95 % - -  10/31/15 1254 - - 86 - 95 % - -  10/31/15 1251 138/89 mmHg - 89 - - - -  10/31/15 1250 - - - 18 - - -  10/31/15 1249 - - 89 - 96 % - -  10/31/15 1244 - - 94 - 95 % - -  10/31/15 1241 133/86 mmHg - 94 - - - -  10/31/15 1239 - - 96 - 96 % - -  10/31/15 1234 - - 94 - 96 % - -  10/31/15 1232 128/87 mmHg - 88 - - - -  10/31/15 1222 - - 85 - 97 % - -  10/31/15 1221 145/90 mmHg - 85 - - - -  10/31/15 1217 - - 93 - 96 % - -  10/31/15 1212 - - 99 - 95 % - -  10/31/15 1211 129/87 mmHg - 95 - - - -  10/31/15 1207 - - 96 - 94 % - -  10/31/15 1202 - - 99 - 94 % - -  10/31/15 1201 131/86 mmHg - 97 18 - - -  10/31/15 1157 - - 99 - 97 % - -  10/31/15 1152 - - 98 - 94 % - -  10/31/15 1151 125/82 mmHg - 91 - 94 % - -  10/31/15 1150 - - 104 - - - -  10/31/15 1146 - - 100 - (!) 86 % - -  10/31/15 1144 137/92 mmHg - 93 18 97 % - -  10/31/15 1134 150/90 mmHg Oral 105 18 - 4\' 11"  (1.499 m) 149 lb 6.4 oz (67.767 kg)   Constitutional: Well-developed, well-nourished female in no acute distress.  HEART: normal rate, heart sounds, regular rhythm RESP: normal effort, lung sounds clear and equal bilaterally GI: Abd soft, non-tender, gravid appropriate for gestational age.  MS: Extremities nontender, no edema, normal ROM Neurologic: Alert and oriented x 4.  GU: Neg CVAT.   Dilation: Fingertip Effacement (%): 40 Cervical Position: Posterior Station: -2 Presentation: Vertex Exam by:: Devonda Pequignot leftwich-kirby,CNM  FHT:  Baseline 130, moderate variability, accelerations present, no decelerations Contractions: q  2-10 mins, irregular, mild to palpation   Labs: Results for orders placed or performed during the hospital encounter of 10/31/15 (from the past 24 hour(s))  CBC     Status: Abnormal   Collection Time:  10/31/15 12:11 PM  Result Value Ref Range   WBC 12.7 (H) 4.0 - 10.5 K/uL   RBC 4.10 3.87 - 5.11 MIL/uL   Hemoglobin 12.1 12.0 - 15.0 g/dL   HCT 16.1 (L) 09.6 - 04.5 %   MCV 86.8 78.0 - 100.0 fL   MCH 29.5 26.0 - 34.0 pg   MCHC 34.0 30.0 - 36.0 g/dL   RDW 40.9 81.1 - 91.4 %   Platelets 218 150 - 400 K/uL  Comprehensive metabolic panel     Status: Abnormal   Collection Time: 10/31/15 12:11 PM  Result Value Ref Range   Sodium 137 135 - 145 mmol/L   Potassium 3.6 3.5 - 5.1 mmol/L   Chloride 106 101 - 111 mmol/L   CO2 19 (L) 22 - 32 mmol/L   Glucose, Bld 100 (H) 65 - 99 mg/dL   BUN 15 6 - 20 mg/dL   Creatinine, Ser 7.82 0.44 - 1.00 mg/dL   Calcium 9.2 8.9 - 95.6 mg/dL   Total Protein 7.2 6.5 - 8.1 g/dL   Albumin 3.5 3.5 - 5.0 g/dL   AST 24 15 - 41 U/L   ALT 16 14 - 54 U/L   Alkaline Phosphatase 136 (H) 38 - 126 U/L   Total Bilirubin 0.3 0.3 - 1.2 mg/dL   GFR calc non Af Amer >60 >60 mL/min   GFR calc Af Amer >60 >60 mL/min   Anion gap 12 5 - 15  Uric acid     Status: Abnormal   Collection Time: 10/31/15 12:11 PM  Result Value Ref Range   Uric Acid, Serum 7.3 (H) 2.3 - 6.6 mg/dL  Lactate dehydrogenase     Status: None   Collection Time: 10/31/15 12:11 PM  Result Value Ref Range   LDH 117 98 - 192 U/L  Protein / creatinine ratio, urine     Status: Abnormal   Collection Time: 10/31/15 12:20 PM  Result Value Ref Range   Creatinine, Urine 204.00 mg/dL   Total Protein, Urine 39 mg/dL   Protein Creatinine Ratio 0.19 (H) 0.00 - 0.15 mg/mg[Cre]      MAU Course/MDM: I have ordered labs and reviewed results.  Consult Dr Marcelle Overlie.  IOL for Gestational Hypertension. Discussed IOL with pt, questions answered.  Pt stable at time of transfer.  Assessment:  25 y.o. G2P0010  1. Gestational hypertension, third trimester     Plan: Admit to [facility] for IOL      Medication List    ASK your doctor about these medications        ibuprofen 600 MG tablet  Commonly  known as:  ADVIL,MOTRIN  Take 1 tablet (600 mg total) by mouth every 6 (six) hours as needed.     oxyCODONE-acetaminophen 7.5-325 MG tablet  Commonly known as:  PERCOCET  Take 1 tablet by mouth every 4 (four) hours as needed for pain.     prenatal multivitamin Tabs tablet  Take 1 tablet by mouth daily at 12 noon.        Sharen Counter Certified Nurse-Midwife 10/31/2015 2:36 PM

## 2015-10-31 NOTE — MAU Note (Signed)
Pt reports she has had ctx on and off since 02:30. Ctx now about 6-10 min apart. hasa had some leking. Checked for SROM yesterday and it was negative. Pt c/o HA

## 2015-10-31 NOTE — Anesthesia Pain Management Evaluation Note (Signed)
  CRNA Pain Management Visit Note  Patient: [name] [name], 25 y.o., female  "Hello I am a member of the anesthesia team at [facility]. We have an anesthesia team available at all times to provide care throughout the hospital, including epidural management and anesthesia for C-section. I don't know your plan for the delivery whether it a natural birth, water birth, IV sedation, nitrous supplementation, doula or epidural, but we want to meet your pain goals."   1.Was your pain managed to your expectations on prior hospitalizations?   Patient has no prior experience with labor or epidurals.  2.What is your expectation for pain management during this hospitalization?     Epidural  3.How can we help you reach that goal? epidural  Record the patient's initial score and the patient's pain goal.   Pain: 0 between contractions; 6 during contractions  Pain Goal: 6 [facility] wants you to be able to say your pain was always managed very well.  Margene Cherian L 10/31/2015

## 2015-11-01 ENCOUNTER — Encounter (HOSPITAL_COMMUNITY)
Admission: AD | Disposition: A | Discharge status: Home / Self Care | Payer: Self-pay | Source: Ambulatory Visit | Attending: Obstetrics and Gynecology

## 2015-11-01 ENCOUNTER — Inpatient Hospital Stay (HOSPITAL_COMMUNITY): Payer: Medicaid Other | Admitting: Anesthesiology

## 2015-11-01 ENCOUNTER — Encounter (HOSPITAL_COMMUNITY): Payer: Self-pay | Admitting: Certified Registered Nurse Anesthetist

## 2015-11-01 LAB — CBC
HCT: 36.1 % (ref 36.0–46.0)
HEMATOCRIT: 35.3 % — AB (ref 36.0–46.0)
HEMOGLOBIN: 12 g/dL (ref 12.0–15.0)
Hemoglobin: 12.2 g/dL (ref 12.0–15.0)
MCH: 29.5 pg (ref 26.0–34.0)
MCH: 29.6 pg (ref 26.0–34.0)
MCHC: 33.8 g/dL (ref 30.0–36.0)
MCHC: 34 g/dL (ref 30.0–36.0)
MCV: 86.9 fL (ref 78.0–100.0)
MCV: 87.4 fL (ref 78.0–100.0)
PLATELETS: 183 10*3/uL (ref 150–400)
Platelets: 203 10*3/uL (ref 150–400)
RBC: 4.06 MIL/uL (ref 3.87–5.11)
RBC: 4.13 MIL/uL (ref 3.87–5.11)
RDW: 13.4 % (ref 11.5–15.5)
RDW: 13.5 % (ref 11.5–15.5)
WBC: 15.9 10*3/uL — ABNORMAL HIGH (ref 4.0–10.5)
WBC: 17.9 10*3/uL — AB (ref 4.0–10.5)

## 2015-11-01 LAB — COMPREHENSIVE METABOLIC PANEL
ALBUMIN: 3.3 g/dL — AB (ref 3.5–5.0)
ALT: 12 U/L — ABNORMAL LOW (ref 14–54)
AST: 20 U/L (ref 15–41)
Alkaline Phosphatase: 132 U/L — ABNORMAL HIGH (ref 38–126)
Anion gap: 10 (ref 5–15)
BUN: 10 mg/dL (ref 6–20)
CALCIUM: 8.6 mg/dL — AB (ref 8.9–10.3)
CHLORIDE: 107 mmol/L (ref 101–111)
CO2: 21 mmol/L — ABNORMAL LOW (ref 22–32)
Creatinine, Ser: 0.55 mg/dL (ref 0.44–1.00)
GFR calc Af Amer: >60 mL/min (ref >60)
GFR calc non Af Amer: >60 mL/min (ref >60)
GLUCOSE: 77 mg/dL (ref 65–99)
POTASSIUM: 3.8 mmol/L (ref 3.5–5.1)
Sodium: 138 mmol/L (ref 135–145)
TOTAL PROTEIN: 6.6 g/dL (ref 6.5–8.1)
Total Bilirubin: 0.3 mg/dL (ref 0.3–1.2)

## 2015-11-01 LAB — RPR: RPR: NONREACTIVE

## 2015-11-01 SURGERY — Surgical Case
Anesthesia: Regional

## 2015-11-01 MED ORDER — LIDOCAINE-EPINEPHRINE (PF) 2 %-1:200000 IJ SOLN
INTRAMUSCULAR | Status: AC
  Filled 2015-11-01: qty 20

## 2015-11-01 MED ORDER — PHENYLEPHRINE 40 MCG/ML (10ML) SYRINGE FOR IV PUSH (FOR BLOOD PRESSURE SUPPORT)
PREFILLED_SYRINGE | INTRAVENOUS | Status: AC
  Filled 2015-11-01: qty 20

## 2015-11-01 MED ORDER — ONDANSETRON HCL 4 MG/2ML IJ SOLN
INTRAMUSCULAR | Status: AC
  Filled 2015-11-01: qty 2

## 2015-11-01 MED ORDER — NALOXONE HCL 2 MG/2ML IJ SOSY: 1.0000 ug/kg/h | PREFILLED_SYRINGE | INTRAVENOUS | Status: DC | PRN

## 2015-11-01 MED ORDER — OXYCODONE HCL 5 MG PO TABS
5.0000 mg | ORAL_TABLET | ORAL | Status: DC | PRN
  Administered 2015-11-02 – 2015-11-03 (×4): 5 mg via ORAL
  Filled 2015-11-01 (×3): qty 1

## 2015-11-01 MED ORDER — KETOROLAC TROMETHAMINE 30 MG/ML IJ SOLN
30.0000 mg | Freq: Four times a day (QID) | INTRAMUSCULAR | Status: AC | PRN
Start: 2015-11-01 — End: 2015-11-02

## 2015-11-01 MED ORDER — DIPHENHYDRAMINE HCL 25 MG PO CAPS: 25.0000 mg | ORAL_CAPSULE | ORAL | Status: DC | PRN

## 2015-11-01 MED ORDER — MEPERIDINE HCL 25 MG/ML IJ SOLN
INTRAMUSCULAR | Status: AC
  Filled 2015-11-01: qty 1

## 2015-11-01 MED ORDER — IBUPROFEN 600 MG PO TABS
600.0000 mg | ORAL_TABLET | Freq: Four times a day (QID) | ORAL | Status: DC
  Administered 2015-11-02 – 2015-11-03 (×6): 600 mg via ORAL
  Filled 2015-11-01 (×6): qty 1

## 2015-11-01 MED ORDER — FENTANYL 2.5 MCG/ML BUPIVACAINE 1/10 % EPIDURAL INFUSION (WH - ANES)
INTRAMUSCULAR | Status: AC
  Administered 2015-11-01: 12 mL/h via EPIDURAL
  Filled 2015-11-01: qty 125

## 2015-11-01 MED ORDER — ONDANSETRON HCL 4 MG/2ML IJ SOLN
INTRAMUSCULAR | Status: DC | PRN
  Administered 2015-11-01: 4 mg via INTRAVENOUS

## 2015-11-01 MED ORDER — LIDOCAINE HCL (PF) 1 % IJ SOLN
INTRAMUSCULAR | Status: DC | PRN
  Administered 2015-11-01: 6 mL via EPIDURAL
  Administered 2015-11-01: 5 mL via EPIDURAL

## 2015-11-01 MED ORDER — CHLOROPROCAINE HCL (PF) 3 % IJ SOLN
INTRAMUSCULAR | Status: AC
  Filled 2015-11-01: qty 20

## 2015-11-01 MED ORDER — CEFAZOLIN SODIUM 1-5 GM-% IV SOLN
1.0000 g | Freq: Once | INTRAVENOUS | Status: AC
  Administered 2015-11-01: 1 g via INTRAVENOUS
  Filled 2015-11-01: qty 50

## 2015-11-01 MED ORDER — ONDANSETRON HCL 4 MG/2ML IJ SOLN: 4.0000 mg | Freq: Three times a day (TID) | INTRAMUSCULAR | Status: DC | PRN

## 2015-11-01 MED ORDER — OXYTOCIN 40 UNITS IN LACTATED RINGERS INFUSION - SIMPLE MED: 2.5000 [IU]/h | INTRAVENOUS | Status: AC

## 2015-11-01 MED ORDER — ACETAMINOPHEN 325 MG PO TABS
650.0000 mg | ORAL_TABLET | ORAL | Status: DC | PRN
  Administered 2015-11-02: 650 mg via ORAL
  Filled 2015-11-01: qty 2

## 2015-11-01 MED ORDER — WITCH HAZEL-GLYCERIN EX PADS: 1.0000 "application " | MEDICATED_PAD | CUTANEOUS | Status: DC | PRN

## 2015-11-01 MED ORDER — NALBUPHINE HCL 10 MG/ML IJ SOLN: 5.0000 mg | Freq: Once | INTRAMUSCULAR | Status: DC | PRN

## 2015-11-01 MED ORDER — LACTATED RINGERS IV SOLN
INTRAVENOUS | Status: DC
  Administered 2015-11-01: 22:00:00 via INTRAVENOUS

## 2015-11-01 MED ORDER — DIPHENHYDRAMINE HCL 50 MG/ML IJ SOLN: 12.5000 mg | INTRAMUSCULAR | Status: DC | PRN

## 2015-11-01 MED ORDER — OXYTOCIN 10 UNIT/ML IJ SOLN
INTRAMUSCULAR | Status: AC
  Filled 2015-11-01: qty 4

## 2015-11-01 MED ORDER — LACTATED RINGERS IV SOLN: 500.0000 mL | Freq: Once | INTRAVENOUS | Status: DC

## 2015-11-01 MED ORDER — MENTHOL 3 MG MT LOZG: 1.0000 | LOZENGE | OROMUCOSAL | Status: DC | PRN

## 2015-11-01 MED ORDER — CHLOROPROCAINE HCL (PF) 3 % IJ SOLN
INTRAMUSCULAR | Status: DC | PRN
  Administered 2015-11-01: 20 mL

## 2015-11-01 MED ORDER — MEPERIDINE HCL 25 MG/ML IJ SOLN: 6.2500 mg | INTRAMUSCULAR | Status: DC | PRN

## 2015-11-01 MED ORDER — MORPHINE SULFATE (PF) 0.5 MG/ML IJ SOLN
INTRAMUSCULAR | Status: DC | PRN
  Administered 2015-11-01: 2 mg via INTRAVENOUS
  Administered 2015-11-01: 3 mg via EPIDURAL

## 2015-11-01 MED ORDER — KETOROLAC TROMETHAMINE 30 MG/ML IJ SOLN
INTRAMUSCULAR | Status: AC
  Administered 2015-11-01: 30 mg
  Filled 2015-11-01: qty 1

## 2015-11-01 MED ORDER — SODIUM BICARBONATE 8.4 % IV SOLN
INTRAVENOUS | Status: AC
  Filled 2015-11-01: qty 50

## 2015-11-01 MED ORDER — CEFAZOLIN SODIUM-DEXTROSE 2-3 GM-% IV SOLR
INTRAVENOUS | Status: DC | PRN
  Administered 2015-11-01: 2 g via INTRAVENOUS

## 2015-11-01 MED ORDER — NALBUPHINE HCL 10 MG/ML IJ SOLN: 5.0000 mg | INTRAMUSCULAR | Status: DC | PRN

## 2015-11-01 MED ORDER — SODIUM CHLORIDE 0.9% FLUSH: 3.0000 mL | INTRAVENOUS | Status: DC | PRN

## 2015-11-01 MED ORDER — BUPIVACAINE HCL (PF) 0.25 % IJ SOLN
INTRAMUSCULAR | Status: DC | PRN
  Administered 2015-11-01: 6 mL via EPIDURAL

## 2015-11-01 MED ORDER — SCOPOLAMINE 1 MG/3DAYS TD PT72
MEDICATED_PATCH | TRANSDERMAL | Status: AC
  Filled 2015-11-01: qty 1

## 2015-11-01 MED ORDER — PHENYLEPHRINE 40 MCG/ML (10ML) SYRINGE FOR IV PUSH (FOR BLOOD PRESSURE SUPPORT): 80.0000 ug | PREFILLED_SYRINGE | INTRAVENOUS | Status: DC | PRN

## 2015-11-01 MED ORDER — SCOPOLAMINE 1 MG/3DAYS TD PT72: 1.0000 | MEDICATED_PATCH | Freq: Once | TRANSDERMAL | Status: DC

## 2015-11-01 MED ORDER — OXYCODONE HCL 5 MG PO TABS
10.0000 mg | ORAL_TABLET | ORAL | Status: DC | PRN
  Administered 2015-11-02: 10 mg via ORAL
  Filled 2015-11-01 (×2): qty 2

## 2015-11-01 MED ORDER — COCONUT OIL OIL: 1.0000 "application " | TOPICAL_OIL | Status: DC | PRN

## 2015-11-01 MED ORDER — OXYTOCIN 10 UNIT/ML IJ SOLN
40.0000 [IU] | INTRAMUSCULAR | Status: DC | PRN
  Administered 2015-11-01: 40 [IU] via INTRAVENOUS

## 2015-11-01 MED ORDER — SODIUM CHLORIDE 0.9 % IR SOLN
Status: DC | PRN
  Administered 2015-11-01: 1000 mL

## 2015-11-01 MED ORDER — EPHEDRINE 5 MG/ML INJ: 10.0000 mg | INTRAVENOUS | Status: DC | PRN

## 2015-11-01 MED ORDER — SIMETHICONE 80 MG PO CHEW: 80.0000 mg | CHEWABLE_TABLET | ORAL | Status: DC | PRN

## 2015-11-01 MED ORDER — SCOPOLAMINE 1 MG/3DAYS TD PT72
MEDICATED_PATCH | TRANSDERMAL | Status: DC | PRN
  Administered 2015-11-01: 1 via TRANSDERMAL

## 2015-11-01 MED ORDER — FENTANYL CITRATE (PF) 100 MCG/2ML IJ SOLN: 25.0000 ug | INTRAMUSCULAR | Status: DC | PRN

## 2015-11-01 MED ORDER — SODIUM BICARBONATE 8.4 % IV SOLN
INTRAVENOUS | Status: DC | PRN
  Administered 2015-11-01 (×3): 5 mL via EPIDURAL
  Administered 2015-11-01: 3 mL via EPIDURAL

## 2015-11-01 MED ORDER — METOCLOPRAMIDE HCL 5 MG/ML IJ SOLN: 10.0000 mg | Freq: Once | INTRAMUSCULAR | Status: DC | PRN

## 2015-11-01 MED ORDER — DIBUCAINE 1 % RE OINT
1.0000 | TOPICAL_OINTMENT | RECTAL | Status: DC | PRN
Start: 2015-11-01 — End: 2015-11-03

## 2015-11-01 MED ORDER — ZOLPIDEM TARTRATE 5 MG PO TABS: 5.0000 mg | ORAL_TABLET | Freq: Every evening | ORAL | Status: DC | PRN

## 2015-11-01 MED ORDER — MEPERIDINE HCL 25 MG/ML IJ SOLN
INTRAMUSCULAR | Status: DC | PRN
  Administered 2015-11-01 (×2): 12.5 mg via INTRAVENOUS

## 2015-11-01 MED ORDER — LACTATED RINGERS IV SOLN
INTRAVENOUS | Status: DC
Start: 2015-11-01 — End: 2015-11-01

## 2015-11-01 MED ORDER — TETANUS-DIPHTH-ACELL PERTUSSIS 5-2.5-18.5 LF-MCG/0.5 IM SUSP: 0.5000 mL | Freq: Once | INTRAMUSCULAR | Status: DC

## 2015-11-01 MED ORDER — SIMETHICONE 80 MG PO CHEW
80.0000 mg | CHEWABLE_TABLET | ORAL | Status: DC
  Administered 2015-11-02 (×2): 80 mg via ORAL
  Filled 2015-11-01 (×2): qty 1

## 2015-11-01 MED ORDER — SIMETHICONE 80 MG PO CHEW
80.0000 mg | CHEWABLE_TABLET | Freq: Three times a day (TID) | ORAL | Status: DC
  Administered 2015-11-02 – 2015-11-03 (×3): 80 mg via ORAL
  Filled 2015-11-01 (×3): qty 1

## 2015-11-01 MED ORDER — KETOROLAC TROMETHAMINE 30 MG/ML IJ SOLN: 30.0000 mg | Freq: Four times a day (QID) | INTRAMUSCULAR | Status: AC | PRN

## 2015-11-01 MED ORDER — MORPHINE SULFATE (PF) 0.5 MG/ML IJ SOLN
INTRAMUSCULAR | Status: AC
  Filled 2015-11-01: qty 10

## 2015-11-01 MED ORDER — PRENATAL MULTIVITAMIN CH
1.0000 | ORAL_TABLET | Freq: Every day | ORAL | Status: DC
Start: 2015-11-02 — End: 2015-11-03
  Administered 2015-11-02 – 2015-11-03 (×2): 1 via ORAL
  Filled 2015-11-01 (×2): qty 1

## 2015-11-01 MED ORDER — SENNOSIDES-DOCUSATE SODIUM 8.6-50 MG PO TABS
2.0000 | ORAL_TABLET | ORAL | Status: DC
  Administered 2015-11-02 (×2): 2 via ORAL
  Filled 2015-11-01 (×2): qty 2

## 2015-11-01 MED ORDER — NALOXONE HCL 0.4 MG/ML IJ SOLN: 0.4000 mg | INTRAMUSCULAR | Status: DC | PRN

## 2015-11-01 MED ORDER — CEFAZOLIN SODIUM-DEXTROSE 2-4 GM/100ML-% IV SOLN
INTRAVENOUS | Status: AC
  Filled 2015-11-01: qty 100

## 2015-11-01 MED ORDER — DIPHENHYDRAMINE HCL 25 MG PO CAPS: 25.0000 mg | ORAL_CAPSULE | Freq: Four times a day (QID) | ORAL | Status: DC | PRN

## 2015-11-01 SURGICAL SUPPLY — 36 items
APL SKNCLS STERI-STRIP NONHPOA (GAUZE/BANDAGES/DRESSINGS) ×1
BENZOIN TINCTURE PRP APPL 2/3 (GAUZE/BANDAGES/DRESSINGS) ×2 IMPLANT
CLAMP CORD UMBIL (MISCELLANEOUS) IMPLANT
CLOSURE WOUND 1/2 X4 (GAUZE/BANDAGES/DRESSINGS) ×1
CLOTH BEACON ORANGE TIMEOUT ST (SAFETY) ×3 IMPLANT
CONTAINER PREFILL 10% NBF 15ML (MISCELLANEOUS) IMPLANT
DRSG OPSITE POSTOP 4X10 (GAUZE/BANDAGES/DRESSINGS) ×3 IMPLANT
DURAPREP 26ML APPLICATOR (WOUND CARE) ×3 IMPLANT
ELECT REM PT RETURN 9FT ADLT (ELECTROSURGICAL) ×3
ELECTRODE REM PT RTRN 9FT ADLT (ELECTROSURGICAL) ×1 IMPLANT
EXTRACTOR VACUUM M CUP 4 TUBE (SUCTIONS) IMPLANT
EXTRACTOR VACUUM M CUP 4' TUBE (SUCTIONS)
GLOVE BIO SURGEON STRL SZ8 (GLOVE) ×3 IMPLANT
GLOVE BIOGEL PI IND STRL 7.0 (GLOVE) ×1 IMPLANT
GLOVE BIOGEL PI INDICATOR 7.0 (GLOVE) ×2
GOWN STRL REUS W/TWL LRG LVL3 (GOWN DISPOSABLE) ×6 IMPLANT
KIT ABG SYR 3ML LUER SLIP (SYRINGE) ×3 IMPLANT
LIQUID BAND (GAUZE/BANDAGES/DRESSINGS) IMPLANT
NDL HYPO 25X5/8 SAFETYGLIDE (NEEDLE) ×1 IMPLANT
NEEDLE HYPO 25X5/8 SAFETYGLIDE (NEEDLE) ×3 IMPLANT
NS IRRIG 1000ML POUR BTL (IV SOLUTION) ×3 IMPLANT
PACK C SECTION WH (CUSTOM PROCEDURE TRAY) ×3 IMPLANT
PAD OB MATERNITY 4.3X12.25 (PERSONAL CARE ITEMS) ×3 IMPLANT
PENCIL SMOKE EVAC W/HOLSTER (ELECTROSURGICAL) ×3 IMPLANT
STRIP CLOSURE SKIN 1/2X4 (GAUZE/BANDAGES/DRESSINGS) ×1 IMPLANT
SUT CHROMIC 2 0 SH (SUTURE) ×2 IMPLANT
SUT MNCRL 0 VIOLET CTX 36 (SUTURE) ×4 IMPLANT
SUT MONOCRYL 0 CTX 36 (SUTURE) ×8
SUT PDS AB 0 CTX 60 (SUTURE) ×3 IMPLANT
SUT PLAIN 0 NONE (SUTURE) IMPLANT
SUT PLAIN 2 0 (SUTURE) ×3
SUT PLAIN 2 0 XLH (SUTURE) IMPLANT
SUT PLAIN ABS 2-0 CT1 27XMFL (SUTURE) IMPLANT
SUT VIC AB 4-0 KS 27 (SUTURE) ×3 IMPLANT
TOWEL OR 17X24 6PK STRL BLUE (TOWEL DISPOSABLE) ×3 IMPLANT
TRAY FOLEY CATH SILVER 14FR (SET/KITS/TRAYS/PACK) ×3 IMPLANT

## 2015-11-01 NOTE — Progress Notes (Signed)
Cx 4/puffy/-2 FHT cat one Ucs q2-3 min IUPC placed D/W above with patient and FOB

## 2015-11-01 NOTE — Progress Notes (Signed)
Cx no change FHT cat one A/P: arrest of dilation. Recommend cesarean section. D/W risks including infection, organ damage, bleeding/transfusion-HIV/Hep, DVT/PE, pneumonia. Patient states she understands and agrees. All questions answered.

## 2015-11-01 NOTE — Anesthesia Postprocedure Evaluation (Signed)
Anesthesia Post Note  Patient: [name] [name]  Procedure(s) Performed: Procedure(s) (LRB): CESAREAN SECTION (N/A)  Patient location during evaluation: PACU Anesthesia Type: Epidural Level of consciousness: awake and alert Pain management: pain level controlled Vital Signs Assessment: post-procedure vital signs reviewed and stable Respiratory status: spontaneous breathing, nonlabored ventilation, respiratory function stable and patient connected to nasal cannula oxygen Cardiovascular status: blood pressure returned to baseline and stable Postop Assessment: no signs of nausea or vomiting, epidural receding and patient able to bend at knees Anesthetic complications: no    Last Vitals:  Filed Vitals:   11/01/15 1802 11/01/15 1815  BP:  144/96  Pulse: 87 89  Temp:    Resp: 19 20    Last Pain:  Filed Vitals:   11/01/15 1819  PainSc: 0-No pain                 Yue Glasheen J

## 2015-11-01 NOTE — Anesthesia Procedure Notes (Signed)
Epidural Patient location during procedure: OB Start time: 11/01/2015 12:36 AM End time: 11/01/2015 12:54 AM  Staffing Anesthesiologist: Jairo BenJACKSON, Maleki Hippe Performed by: anesthesiologist   Preanesthetic Checklist Completed: patient identified, surgical consent, pre-op evaluation, timeout performed, IV checked, risks and benefits discussed and monitors and equipment checked  Epidural Patient position: sitting Prep: ChloraPrep and site prepped and draped Patient monitoring: heart rate, continuous pulse ox and blood pressure Approach: midline Location: L3-L4 Injection technique: LOR air  Needle:  Needle type: Tuohy  Needle gauge: 17 G Needle length: 9 cm Needle insertion depth: 7 cm Catheter type: closed end flexible Catheter size: 19 Gauge Catheter at skin depth: 13 cm Test dose: negative (1% lidocaine)  Additional Notes Pt identified in Labor room.  Monitors applied. Working IV access confirmed. Sterile prep, drape lumbar spine.  1% lido local L 3,4.  #17ga Touhy LOR air 7cm, cath in easily to 13cm skin.  1% lido test OK, dosed, infusion begun.  Patient asymptomatic, VSS, no heme aspirated, tolerated well.  Sandford Craze Dymir Neeson, MD

## 2015-11-01 NOTE — Anesthesia Preprocedure Evaluation (Addendum)
Anesthesia Evaluation  Patient identified by MRN, date of birth, ID band Patient awake    Reviewed: Allergy & Precautions, NPO status , Patient's Chart, lab work & pertinent test results  History of Anesthesia Complications Negative for: history of anesthetic complications  Airway Mallampati: II  TM Distance: >3 FB Neck ROM: Full    Dental  (+) Dental Advisory Given   Pulmonary former smoker (quit 2016),    breath sounds clear to auscultation       Cardiovascular hypertension (Pregnancy induced, no meds),  Rhythm:Regular Rate:Normal     Neuro/Psych negative neurological ROS     GI/Hepatic negative GI ROS, Neg liver ROS,   Endo/Other  negative endocrine ROS  Renal/GU negative Renal ROS     Musculoskeletal   Abdominal   Peds  Hematology negative hematology ROS (+) plt 203K   Anesthesia Other Findings   Reproductive/Obstetrics (+) Pregnancy                            Anesthesia Physical Anesthesia Plan  ASA: II  Anesthesia Plan: Epidural   Post-op Pain Management:    Induction:   Airway Management Planned: Natural Airway  Additional Equipment:   Intra-op Plan:   Post-operative Plan:   Informed Consent: I have reviewed the patients History and Physical, chart, labs and discussed the procedure including the risks, benefits and alternatives for the proposed anesthesia with the patient or authorized representative who has indicated his/her understanding and acceptance.   Dental advisory given  Plan Discussed with: CRNA and Surgeon  Anesthesia Plan Comments: (Patient identified. Risks/Benefits/Options discussed with patient including but not limited to bleeding, infection, nerve damage, paralysis, failed block, incomplete pain control, headache, blood pressure changes, nausea, vomiting, reactions to medication both or allergic, itching and postpartum back pain. Confirmed with  bedside nurse the patient's most recent platelet count. Confirmed with patient that they are not currently taking any anticoagulation, have any bleeding history or any family history of bleeding disorders. Patient expressed understanding and wished to proceed. All questions were answered.  )        Anesthesia Quick Evaluation

## 2015-11-01 NOTE — Progress Notes (Deleted)
Patient called out on call bell yelling for help and that she "needed somebody in here." Several staff members ran to her room, where the visibly upset patient yelled for staff to call the police to get her cell phone back from her boyfriend. The boyfriend was sitting in a chair in the room and returned the cell phone within several seconds of staff being present. He yelled several times that the patient was "crazy" and had been in a mental institution. The patient then threatened to leave the hospital stating she didn't care if she went home and lost the baby. She was able to calm down after 1-2 min and decided she would remain in the hospital. She stated she wanted the FOB to stay with her in the room. The patient and FOB were informed that continued outbursts would result in the FOB being removed from the hospital. Both parties acknowledged that they understood this. Elana Alm- Vonda Harth RNC

## 2015-11-01 NOTE — Progress Notes (Signed)
No CNS change Filed Vitals:   11/01/15 0701 11/01/15 0731  BP: 124/66 122/76  Pulse: 76 75  Temp:  98.2 F (36.8 C)  Resp: 16 16   Lungs CTA DTR 2+ Cx 4/90/-2/vtx AROM clear FHT cat one UCs q2-4 min Pitocin on Epidural infusing All questions answered D/W patient induction

## 2015-11-01 NOTE — Brief Op Note (Signed)
10/31/2015 - 11/01/2015  5:49 PM  PATIENT:  [name] [name]  25 y.o. female  PRE-OPERATIVE DIAGNOSIS:  Primary Cesarean Section Arrest of dilation  POST-OPERATIVE DIAGNOSIS:  Primary Cesarean Section Arrest of dilation  PROCEDURE:  Procedure(s): CESAREAN SECTION (N/A)  SURGEON:  Surgeon(s) and Role:    * Harold HedgeJames Tressia Labrum, MD - Primary  PHYSICIAN ASSISTANT:   ASSISTANTS: Dispensing opticianHather Krietmeyer RN   ANESTHESIA:   epidural  EBL:  Total I/O In: 700 [I.V.:700] Out: 750 [Urine:200; Blood:550]  BLOOD ADMINISTERED:none  DRAINS: Urinary Catheter (Foley)   LOCAL MEDICATIONS USED:  Amount: 30 ml and OTHER sensorcaine  SPECIMEN:  Source of Specimen:  placenta  DISPOSITION OF SPECIMEN:  PATHOLOGY  COUNTS:  YES  TOURNIQUET:  * No tourniquets in log *  DICTATION: .Other Dictation: Dictation Number [id number]  PLAN OF CARE: Admit to inpatient   PATIENT DISPOSITION:  PACU - hemodynamically stable.   Delay start of Pharmacological VTE agent (>24hrs) due to surgical blood loss or risk of bleeding: not applicable

## 2015-11-01 NOTE — Lactation Note (Signed)
This note was copied from a baby's chart. Lactation Consultation Note  Patient Name: [name] [name] ZOXWR'UToday's Date: 11/01/2015 Reason for consult: Initial assessment Baby at 5 hr of life and mom is worried because she has flat nipples. She was able to get baby latched with help to the R breast after birth. The R nipple does have a short shaft but the L is flat. Mom has large pendulous breast that are compressible. Encouraged mom to keep trying to latch baby on her own but gave a #20 NS upon her request. She has her DEBP from home that she would like to start using tonight. Suggested that she stick with latching and manual expression (if needed) for tonight. Discussed baby behavior, feeding frequency, baby belly size, voids, wt loss, breast changes, and nipple care. Demonstrated manual expression, colostrum noted bilaterally, spoon in room. Given lactation handouts. Aware of OP services and support group.       Maternal Data Has patient been taught Hand Expression?: Yes Does the patient have breastfeeding experience prior to this delivery?: No  Feeding Feeding Type: Breast Fed  LATCH Score/Interventions Latch: Repeated attempts needed to sustain latch, nipple held in mouth throughout feeding, stimulation needed to elicit sucking reflex. Intervention(s): Adjust position;Assist with latch;Breast compression  Audible Swallowing: Spontaneous and intermittent Intervention(s): Skin to skin;Hand expression  Type of Nipple: Flat Intervention(s): No intervention needed  Comfort (Breast/Nipple): Soft / non-tender     Hold (Positioning): Assistance needed to correctly position infant at breast and maintain latch. Intervention(s): Position options;Support Pillows  LATCH Score: 7  Lactation Tools Discussed/Used WIC Program: Yes   Consult Status Consult Status: Follow-up Date: 11/02/15 Follow-up type: In-patient    [name] [name] 11/01/2015, 9:41 PM

## 2015-11-01 NOTE — Transfer of Care (Signed)
Immediate Anesthesia Transfer of Care Note  Patient: [name] [name]  Procedure(s) Performed: Procedure(s): CESAREAN SECTION (N/A)  Patient Location: PACU  Anesthesia Type:Epidural  Level of Consciousness: awake, alert , oriented and patient cooperative  Airway & Oxygen Therapy: Patient Spontanous Breathing  Post-op Assessment: Report given to RN and Post -op Vital signs reviewed and stable  Post vital signs: Reviewed and stable  Last Vitals:  Filed Vitals:   11/01/15 1531 11/01/15 1601  BP: 119/69 133/89  Pulse: 86 83  Temp:  36.8 C  Resp: 18     Last Pain:  Filed Vitals:   11/01/15 1650  PainSc: Asleep      Patients Stated Pain Goal: 10 (10/31/15 2009)  Complications: No apparent anesthesia complications

## 2015-11-02 LAB — URINE CULTURE: Culture: NO GROWTH

## 2015-11-02 LAB — CBC
HEMATOCRIT: 28.4 % — AB (ref 36.0–46.0)
Hemoglobin: 9.4 g/dL — ABNORMAL LOW (ref 12.0–15.0)
MCH: 28.9 pg (ref 26.0–34.0)
MCHC: 33.1 g/dL (ref 30.0–36.0)
MCV: 87.4 fL (ref 78.0–100.0)
PLATELETS: 157 10*3/uL (ref 150–400)
RBC: 3.25 MIL/uL — AB (ref 3.87–5.11)
RDW: 13.5 % (ref 11.5–15.5)
WBC: 15.6 10*3/uL — AB (ref 4.0–10.5)

## 2015-11-02 NOTE — Addendum Note (Signed)
Addendum  created 11/02/15 0829 by Rica RecordsAngela Lakera Viall, CRNA   Modules edited: Clinical Notes   Clinical Notes:  File: [id number]

## 2015-11-02 NOTE — Lactation Note (Signed)
This note was copied from a baby's chart. Lactation Consultation Note  Patient Name: [name] [name]: 11/02/2015 Reason for consult: Follow-up assessment Baby at 26 hr of life and mom reports bf is going well. She has used the NS one time since birth. She is using her DEBP from home and offering her milk with a curved tip syring. Reviewed finger feeding. The baby was latched and mom was wearing the nursing cover, visitors were present. Mom denies breast or nipple pain, voiced no concerns. She is aware of lactation services and support group.   Maternal Data    Feeding Feeding Type: Breast Fed Length of feed: 10 min  LATCH Score/Interventions                      Lactation Tools Discussed/Used     Consult Status Consult Status: Follow-up [name]: 11/03/15 Follow-up type: In-patient    Rulon Eisenmengerlizabeth E [name] 11/02/2015, 6:53 PM

## 2015-11-02 NOTE — Op Note (Signed)
NAMLouann Sjogren:  [name], [name]                 ACCOUNT NO.:  [id number]  MEDICAL RECORD NO.:  [id number]  LOCATION:  9105                          FACILITY:  [facility]  PHYSICIAN:  Guy SandiferJames E. Henderson Cloudomblin, M.D. DATE OF BIRTH:  July 28, 1990  DATE OF PROCEDURE:  11/01/2015 DATE OF DISCHARGE:                              OPERATIVE REPORT   PREOPERATIVE DIAGNOSIS:  Arrest of dilation.  POSTOPERATIVE DIAGNOSIS:  Arrest of dilation.  PROCEDURE:  Primary low-transverse cesarean section.  SURGEON:  Guy SandiferJames E. Henderson Cloudomblin, M.D.  ASSISTANT:  Darlina SicilianHeather Kreitmeyer, RN.  ANESTHESIA:  Epidural.  ESTIMATED BLOOD LOSS:  650 mL.  SPECIMENS:  Placenta to Pathology.  FINDINGS:  Viable infant.  Arterial cord pH and Apgars pending.  INDICATIONS AND CONSENT:  This patient is a 25 year old, G2, P0 at 2840- 1/7th weeks, who is admitted for induction of labor due to pregnancy- induced hypertension.  She remains at 4-5 cm for upwards of 10 hours. Diagnosis of arrest of dilation was made.  Cesarean section was recommended.  Potential risks and complications were discussed preoperatively including, but not limited to, infection, organ damage, bleeding requiring transfusion of blood products, HIV and hepatitis acquisition, DVT, PE, pneumonia.  All questions were answered.  The patient states she understands and agrees, and consent was signed on the chart.  DESCRIPTION OF PROCEDURE:  The patient was taken to the operating room where she was identified.  Epidural anesthetic was augmented to the surgical level and she was placed in the dorsal supine position with a 15-degree left lateral wedge.  Foley catheter was already in place.  She was prepped and draped per [facility] protocol.  Time-out was undertaken.  After testing for adequate epidural anesthesia, skin was entered through a Pfannenstiel incision and dissection was carried out in layers of the peritoneum.  Peritoneum was incised and extended superiorly and inferiorly.  At  this point, the patient has increased discomfort and 30 mL of Sensorcaine was poured into the peritoneal cavity.  Vesicouterine peritoneum was then taken down in cephalad lateral fashion.  Bladder flap was developed and bladder blade was placed.  Uterus was incised in a low-transverse manner and the uterine cavity was entered bluntly with a hemostat.  The uterine incision was extended cephalad laterally with fingers.  Baby was easily delivered from the LOT position with no difficulty.  Good cry and tone was noted. Loose nuchal cord x1 was noted.  Cord was clamped and cut and the baby was handed to awaiting Pediatrics team.  Placenta was sent to Pathology. Uterus was exteriorized.  Uterine cavity was clean.  Uterus was closed in two running locking imbricating layers of 0 Monocryl suture.  Upon returning the uterus to the abdomen, a small bleeder at the right angle was noted and under careful visualization, 2-0 chromic was used to obtain complete hemostasis in a superficial manner without difficulty. Lavage was carried out.  Anterior peritoneum was closed in running fashion with 0 Monocryl suture, which was also used to reapproximate the pyramidalis muscle in the midline.  Anterior rectus fascia was closed in a running fashion with a 0 looped PDS suture.  Subcutaneous layer was closed with interrupted plain and the  skin was closed in a subcuticular fashion with a Vicryl on a Keith needle.  Benzoin and Steri-Strips were applied.  Dressings were applied.  All counts were correct.  The patient was taken to the recovery room in stable condition.     Guy Sandifer Henderson Cloud, M.D.     JET/MEDQ  D:  11/01/2015  T:  11/02/2015  Job:  [id number]

## 2015-11-02 NOTE — Progress Notes (Signed)
Subjective: Postpartum Day 1: Cesarean Delivery Patient reports tolerating PO.    Objective: Vital signs in last 24 hours: Temp:  [97.6 F (36.4 C)-99 F (37.2 C)] 98.5 F (36.9 C) (05/13 0310) Pulse Rate:  [68-94] 94 (05/13 0312) Resp:  [16-25] 20 (05/13 0312) BP: (110-150)/(64-98) 113/71 mmHg (05/13 0312) SpO2:  [94 %-99 %] 96 % (05/13 0310)  Physical Exam:  General: alert, cooperative and no distress Lochia: appropriate Uterine Fundus: firm Incision: healing well DVT Evaluation: No evidence of DVT seen on physical exam.   Recent Labs  11/01/15 0001 11/01/15 1320  HGB 12.0 12.2  HCT 35.3* 36.1    Assessment/Plan: Status post Cesarean section. Doing well postoperatively.  Continue current care.  Monigue Spraggins II,Kyndal Gloster E 11/02/2015, 6:04 AM

## 2015-11-02 NOTE — Anesthesia Postprocedure Evaluation (Signed)
Anesthesia Post Note  Patient: [name] [name]  Procedure(s) Performed: Procedure(s) (LRB): CESAREAN SECTION (N/A)  Patient location during evaluation: Mother Baby Anesthesia Type: Epidural Level of consciousness: awake and alert Pain management: pain level controlled Vital Signs Assessment: post-procedure vital signs reviewed and stable Respiratory status: spontaneous breathing, nonlabored ventilation and respiratory function stable Cardiovascular status: stable Postop Assessment: no headache, no backache, epidural receding, no signs of nausea or vomiting and patient able to bend at knees Anesthetic complications: no     Last Vitals:  Filed Vitals:   11/02/15 0312 11/02/15 0600  BP: 113/71 122/78  Pulse: 94 72  Temp:  36.7 C  Resp: 20 20    Last Pain:  Filed Vitals:   11/02/15 0659  PainSc: 0-No pain   Pain Goal: Patients Stated Pain Goal: 10 (10/31/15 2009)               Rica RecordsICKELTON,[name]

## 2015-11-03 MED ORDER — FERROUS SULFATE 325 (65 FE) MG PO TABS: 325.0000 mg | ORAL_TABLET | Freq: Every day | ORAL | Status: DC

## 2015-11-03 MED ORDER — OXYCODONE HCL 5 MG PO TABS: 5.0000 mg | ORAL_TABLET | Freq: Four times a day (QID) | ORAL | Status: DC | PRN

## 2015-11-03 NOTE — Discharge Summary (Signed)
Obstetric Discharge Summary Reason for Admission: induction of labor Prenatal Procedures: none Intrapartum Procedures: cesarean: low cervical, transverse Postpartum Procedures: none Complications-Operative and Postpartum: none HEMOGLOBIN  Date Value Ref Range Status  11/02/2015 9.4* 12.0 - 15.0 g/dL Final    Comment:    REPEATED TO VERIFY DELTA CHECK NOTED    HCT  Date Value Ref Range Status  11/02/2015 28.4* 36.0 - 46.0 % Final    Physical Exam:  General: alert, cooperative and no distress Lochia: appropriate Uterine Fundus: firm Incision: healing well DVT Evaluation: No evidence of DVT seen on physical exam.  Discharge Diagnoses: Term Pregnancy-delivered  Discharge Information: Date: 11/03/2015 Activity: pelvic rest Diet: routine Medications: PNV, Ibuprofen, Iron and Percocet Condition: stable Instructions: refer to practice specific booklet Discharge to: home   Newborn Data: Live born female  Birth Weight: 7 lb 13 oz (3544 g) APGAR: , 9  Home with mother.  Ameliah Baskins II,Jerica Creegan E 11/03/2015, 7:31 AM

## 2015-11-03 NOTE — Discharge Instructions (Signed)
No vagina entry No operation automobiles No heavy lifting

## 2015-11-03 NOTE — Anesthesia Postprocedure Evaluation (Signed)
Anesthesia Post Note  Patient: Lambert ModyDayna S Scott  Procedure(s) Performed: Procedure(s) (LRB): CESAREAN SECTION (N/A)  Patient location during evaluation: Mother Baby Anesthesia Type: Epidural Level of consciousness: awake Pain management: pain level controlled Vital Signs Assessment: post-procedure vital signs reviewed and stable Respiratory status: spontaneous breathing Cardiovascular status: stable Postop Assessment: no headache, no backache, epidural receding, patient able to bend at knees, no signs of nausea or vomiting and adequate PO intake Anesthetic complications: no     Last Vitals:  Filed Vitals:   11/02/15 1800 11/03/15 0500  BP: 129/83 119/76  Pulse: 79 67  Temp: 36.7 C 36.5 C  Resp: 16 18    Last Pain:  Filed Vitals:   11/03/15 0549  PainSc: 4    Pain Goal: Patients Stated Pain Goal: 10 (10/31/15 2009)               Temiloluwa Laredo

## 2015-11-03 NOTE — Addendum Note (Signed)
Addendum  created 11/03/15 0758 by Renford DillsJanet L Wyat Infinger, CRNA   Modules edited: Clinical Notes   Clinical Notes:  File: [id number]

## 2015-11-04 ENCOUNTER — Encounter (HOSPITAL_COMMUNITY): Payer: Self-pay | Admitting: *Deleted

## 2015-11-04 NOTE — H&P (Signed)
[name]:  [name] [name], [name] [name]                 ACCOUNT NO.:  [id number]  MEDICAL RECORD NO.:  [id number]  LOCATION:                                FACILITY:  [facility]  PHYSICIAN:  Duke Salviaichard M. Marcelle OverlieHolland, M.D.DATE OF BIRTH:  Jan 26, 1991  DATE OF ADMISSION:  10/31/2015 DATE OF DISCHARGE:  11/03/2015                             HISTORY & PHYSICAL   CHIEF COMPLAINT:  Gestational hypertension at term.  HPI:  A 25 year old, G2, P-0-0-1-0, EDD is May 11, GBS screen was negative.  She was seen last week.  BP 138/98, 1+ protein, recheck BP 126/84, presents to MAU today with normal lab work as far as liver functions and platelets.  Blood pressure was borderline with reactive NST.  [facility] did not have an elective induction time until the 21st, she is admitted now for induction at term for gestational hypertension.  Her 1 hour GTT was normal at 123.  PAST MEDICAL HISTORY:  Please see the [facility] form for details.  PHYSICAL EXAM:  VITAL SIGNS:  She is afebrile.  Temp was 138/88. LUNGS:  Clear. ABDOMEN:  Term fundal height.  Fetal heart rate 140.  Cervix was a fingertip, long, vertex. EXTREMITIES:  Revealed reflexes 1+.  Mild edema.  No clonus.  IMPRESSION:  Term pregnancy, gestational hypertension for two-stage labor induction.     Munachimso Palin M. Marcelle OverlieHolland, M.D.     RMH/MEDQ  D:  10/31/2015  T:  10/31/2015  Job:  [id number]

## 2016-11-15 ENCOUNTER — Encounter (HOSPITAL_COMMUNITY): Payer: Self-pay | Admitting: *Deleted

## 2016-11-15 ENCOUNTER — Ambulatory Visit (HOSPITAL_COMMUNITY)
Admission: EM | Admit: 2016-11-15 | Discharge: 2016-11-15 | Disposition: A | Discharge status: Home / Self Care | Payer: Medicaid Other | Attending: Family Medicine | Admitting: Family Medicine

## 2016-11-15 DIAGNOSIS — L0291 Cutaneous abscess, unspecified: Secondary | ICD-10-CM | POA: Diagnosis not present

## 2016-11-15 MED ORDER — HYDROCODONE-ACETAMINOPHEN 5-325 MG PO TABS: 1.0000 | ORAL_TABLET | Freq: Four times a day (QID) | ORAL | 0 refills | Status: DC | PRN

## 2016-11-15 MED ORDER — DOXYCYCLINE HYCLATE 100 MG PO TABS: 100.0000 mg | ORAL_TABLET | Freq: Two times a day (BID) | ORAL | 0 refills | Status: DC

## 2016-11-15 NOTE — ED Provider Notes (Signed)
[facility]    CSN: [id number] Arrival date & time: 11/15/16  1626     History   Chief Complaint Chief Complaint  Patient presents with  . Abscess    HPI [name] [name] is a 26 y.o. female.   Pt     Reports    She  Has  A  Boil  Under  l  Armpit    For  Several  Weeks     She  Reports  The    Area  Has  Become  More painfull  And  It  Is  Red  Tender to the touch       [name]-year-old woman who reports painful swelling under her left armpit for several weeks.      Past Medical History:  Diagnosis Date  . No pertinent past medical history   . Vaginal Pap smear, abnormal     Patient Active Problem List   Diagnosis Date Noted  . Pregnancy 10/31/2015    Past Surgical History:  Procedure Laterality Date  . CESAREAN SECTION N/A 11/01/2015   Procedure: CESAREAN SECTION;  Surgeon: Harold Hedge, MD;  Location: [facility];  Service: Obstetrics;  Laterality: N/A;  . COLPOSCOPY VULVA W/ BIOPSY    . DILATION AND EVACUATION N/A 10/01/2014   Procedure: DILATATION AND EVACUATION;  Surgeon: Mitchel Honour, DO;  Location: [facility];  Service: Gynecology;  Laterality: N/A;  . MOUTH SURGERY      OB History    Gravida Para Term Preterm AB Living   2 1 1  0 1 1   SAB TAB Ectopic Multiple Live Births   1 0 0 0 1       Home Medications    Prior to Admission medications   Medication Sig Start Date End Date Taking? Authorizing Provider  doxycycline (VIBRA-TABS) 100 MG tablet Take 1 tablet (100 mg total) by mouth 2 (two) times daily. 11/15/16   Elvina Sidle, MD  ferrous sulfate (FERROUSUL) 325 (65 FE) MG tablet Take 1 tablet (325 mg total) by mouth daily with breakfast. 11/03/15   Harold Hedge, MD  HYDROcodone-acetaminophen (NORCO) 5-325 MG tablet Take 1 tablet by mouth every 6 (six) hours as needed for moderate pain. 11/15/16   Elvina Sidle, MD  Prenatal Vit-Fe Fumarate-FA (PRENATAL MULTIVITAMIN) TABS tablet Take 1 tablet by mouth daily at 12 noon.    [provider]    Family History Family History  Problem Relation Age of Onset  . Diabetes Maternal Grandfather   . Diabetes Paternal Grandmother   . Heart disease Paternal Grandmother     Social History Social History  Substance Use Topics  . Smoking status: Former Smoker    Quit date: 08/21/2014  . Smokeless tobacco: Never Used  . Alcohol use No     Allergies   Patient has no known allergies.   Review of Systems Review of Systems  Skin: Positive for rash.  All other systems reviewed and are negative.    Physical Exam Triage Vital Signs ED Triage Vitals [11/15/16 1654]  Enc Vitals Group     BP 124/72     Pulse Rate 72     Resp 18     Temp 98.6 F (37 C)     Temp Source Oral     SpO2 100 %     Weight      Height      Head Circumference      Peak Flow  Pain Score      Pain Loc      Pain Edu?      Excl. in GC?    No data found.   Updated Vital Signs BP 124/72 (BP Location: Right Arm)   Pulse 72   Temp 98.6 F (37 C) (Oral)   Resp 18   LMP 11/09/2016   SpO2 100%    Physical Exam  Constitutional: She is oriented to person, place, and time. She appears well-developed and well-nourished.  HENT:  Right Ear: External ear normal.  Left Ear: External ear normal.  Mouth/Throat: Oropharynx is clear and moist.  Eyes: EOM are normal. Pupils are equal, round, and reactive to light.  Neck: Normal range of motion. Neck supple.  Pulmonary/Chest: Effort normal.  Musculoskeletal: Normal range of motion.  Neurological: She is alert and oriented to person, place, and time.  Skin: Skin is warm. There is erythema.  Nursing note and vitals reviewed.    UC Treatments / Results  Labs (all labs ordered are listed, but only abnormal results are displayed) Labs Reviewed - No data to display  EKG  EKG Interpretation None       Radiology No results found.  Procedures .Marland Kitchen.Incision and Drainage Date/Time: 11/15/2016 5:11 PM Performed by: Elvina SidleLAUENSTEIN,  Leeana Creer Authorized by: Elvina SidleLAUENSTEIN, Joice Nazario   Consent:    Consent obtained:  Verbal   Consent given by:  Patient and spouse   Risks discussed:  Incomplete drainage and infection   Alternatives discussed:  No treatment Location:    Type:  Abscess   Size:  3 cm   Location:  Upper extremity   Upper extremity location:  Arm   Arm location:  L upper arm Pre-procedure details:    Skin preparation:  Antiseptic wash and Betadine Anesthesia (see MAR for exact dosages):    Anesthesia method:  Local infiltration   Local anesthetic:  Lidocaine 2% WITH epi Procedure type:    Complexity:  Simple Procedure details:    Needle aspiration: no     Incision types:  Stab incision   Incision depth:  Subcutaneous   Scalpel blade:  11   Wound management:  Probed and deloculated   Drainage:  Purulent   Drainage amount:  Copious   Wound treatment:  Wound left open   Packing materials:  None Post-procedure details:    Patient tolerance of procedure:  Tolerated well, no immediate complications   (including critical care time)  Medications Ordered in UC Medications - No data to display   Initial Impression / Assessment and Plan / UC Course  I have reviewed the triage vital signs and the nursing notes.  Pertinent labs & imaging results that were available during my care of the patient were reviewed by me and considered in my medical decision making (see chart for details).     Final Clinical Impressions(s) / UC Diagnoses   Final diagnoses:  Abscess    New Prescriptions New Prescriptions   DOXYCYCLINE (VIBRA-TABS) 100 MG TABLET    Take 1 tablet (100 mg total) by mouth 2 (two) times daily.   HYDROCODONE-ACETAMINOPHEN (NORCO) 5-325 MG TABLET    Take 1 tablet by mouth every 6 (six) hours as needed for moderate pain.     Elvina SidleLauenstein, Rafia Shedden, MD 11/15/16 1714

## 2016-11-15 NOTE — ED Triage Notes (Signed)
Pt     Reports    She  Has  A  Boil  Under  l  Armpit    For  Several  Weeks     She  Reports  The    Area  Has  Become  More painfull  And  It  Is  Red  Tender to the touch

## 2016-11-15 NOTE — Discharge Instructions (Signed)
Apply warm compresses to the abscess site three times a day for next 3 days

## 2017-12-10 IMAGING — US US MFM OB DETAIL+14 WK
1 series · 14 of 28 positions shown · non-contrast
Comparison: none

[Series 1: us mfm ob detail+14 wk · 14 of 117 slices shown]
[im 5/117]
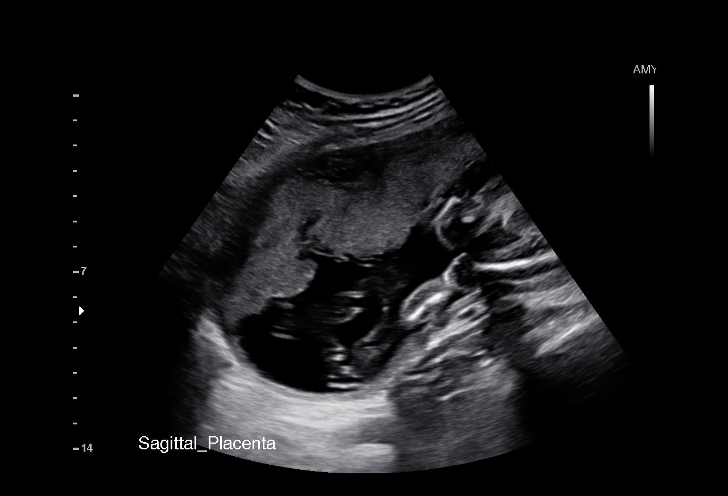
[im 13/117]
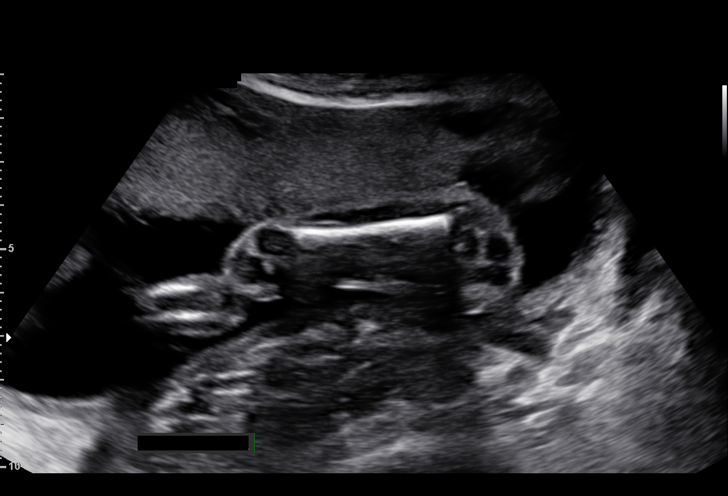
[im 22/117]
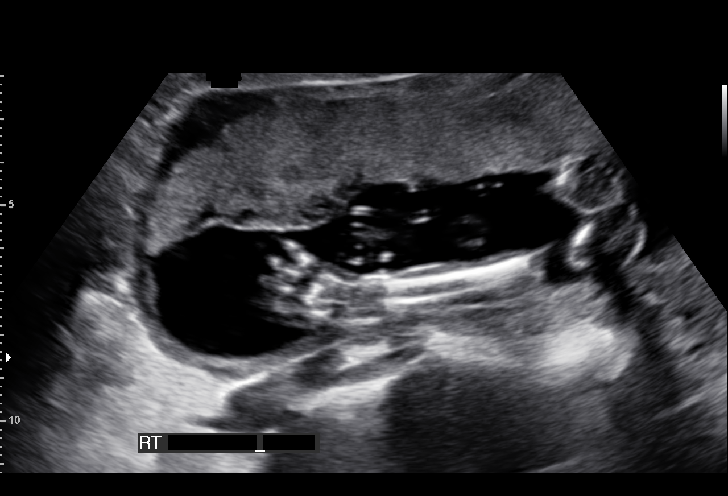
[im 31/117]
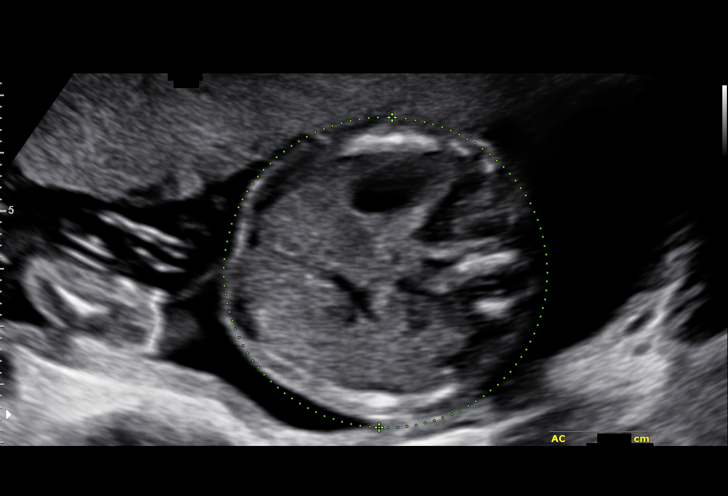
[im 39/117]
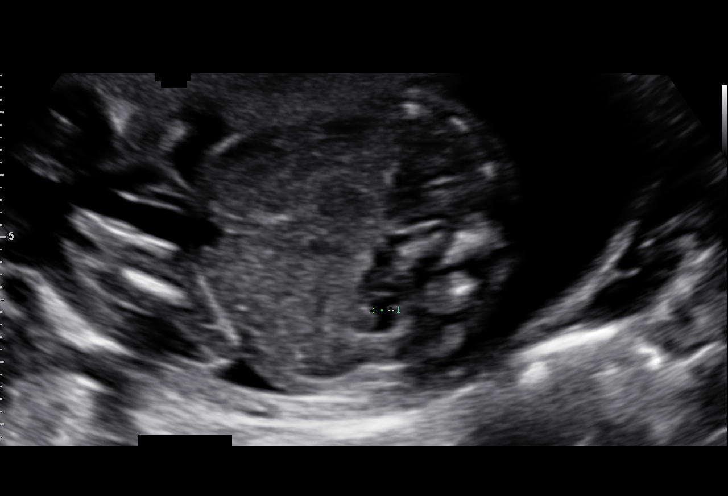
[im 48/117]
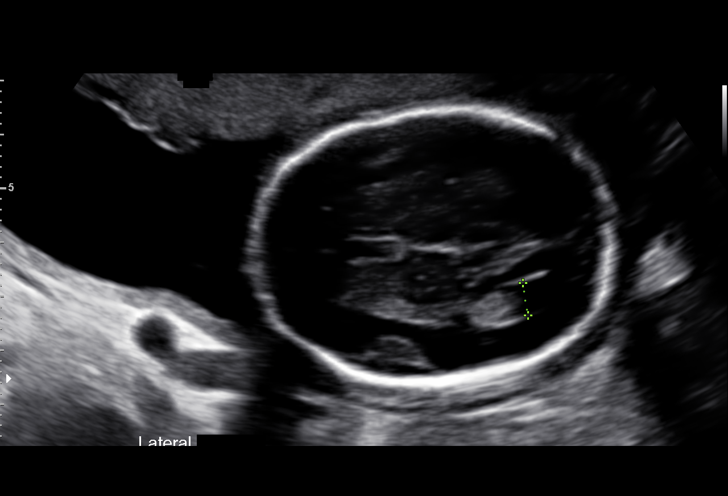
[im 56/117]
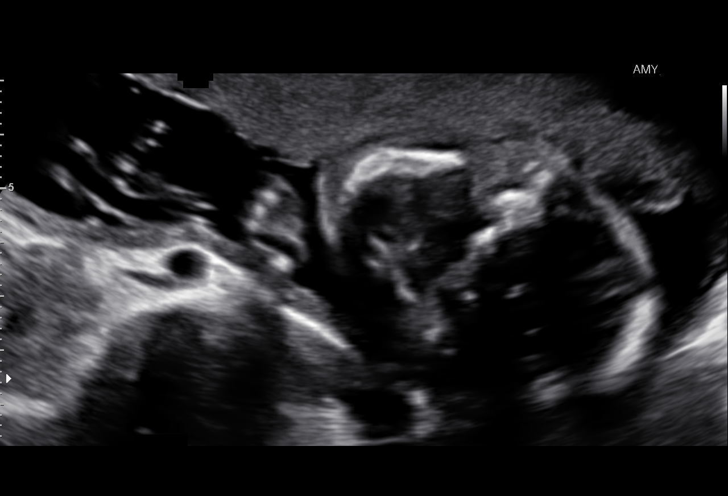
[im 65/117]
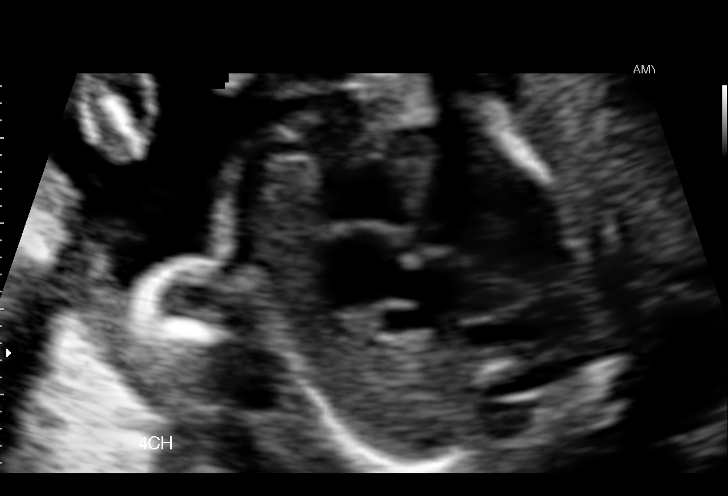
[im 74/117]
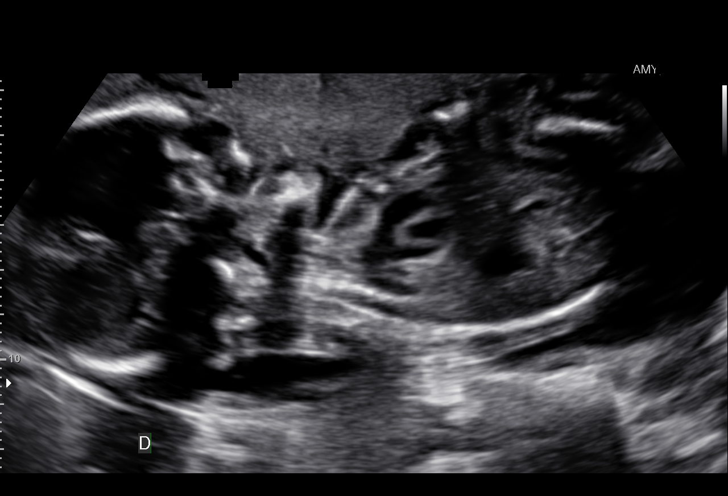
[im 82/117]
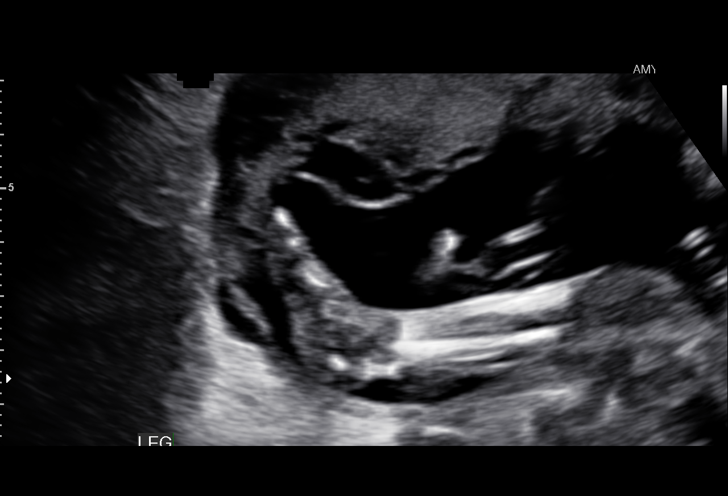
[im 91/117]
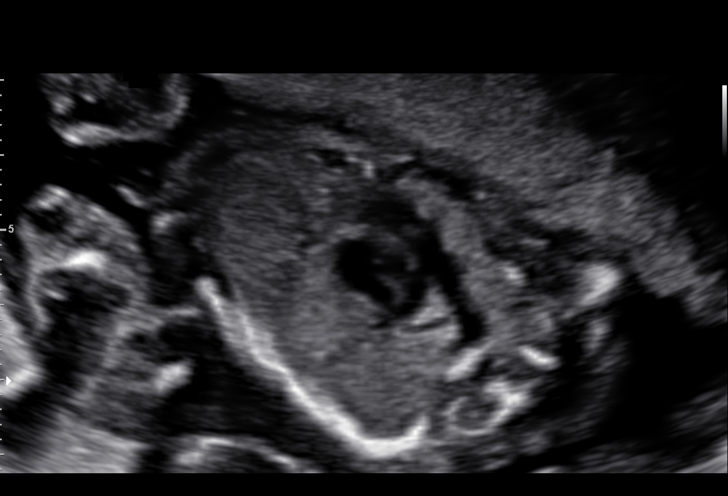
[im 99/117]
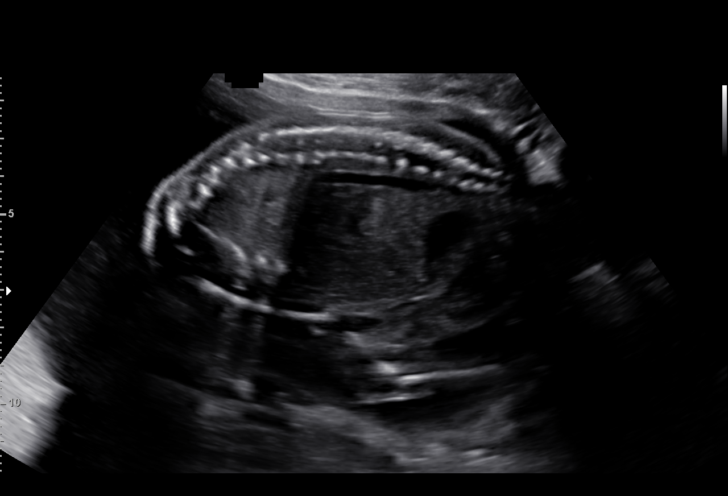
[im 108/117]
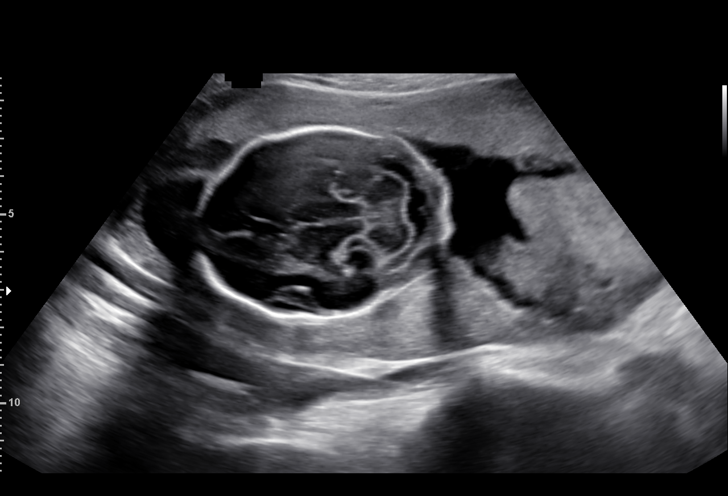
[im 117/117]
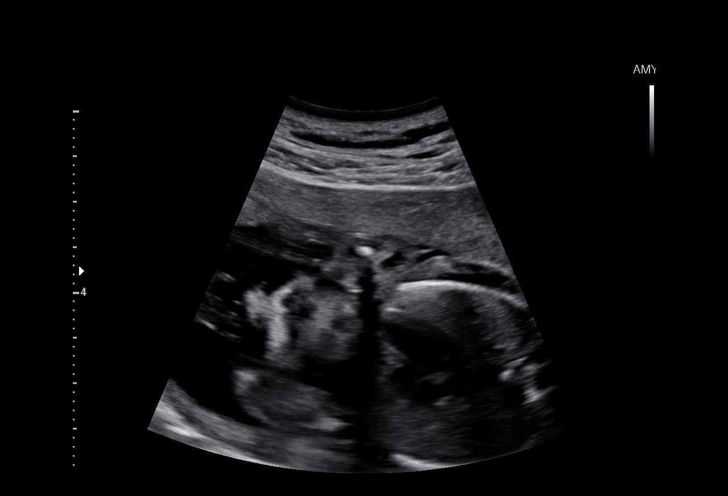

[14 of 28 positions shown; findings below may reference images not displayed]

[REDACTED]. [HOSPITAL]
DO

1  [facility]             [id number]      [id number]     [id number]
Indications

Detailed fetal anatomic survey                 Z36
21 weeks gestation of pregnancy
Fetal abnormality -  suspected; spine views;
CP cysts
OB History

Gravidity:    2         Term:   0        Prem:   0         SAB:   1
TOP:          0       Ectopic:  0        Living: 0
Fetal Evaluation

Num Of Fetuses:     1
Fetal Heart         148
Rate(bpm):
Cardiac Activity:   Observed
Presentation:       Breech
Placenta:           Anterior, above cervical os
P. Cord Insertion:  Visualized

Amniotic Fluid
AFI FV:      Subjectively within normal limits
Larg Pckt:    4.4  cm
Biometry

BPD:        50  mm     G. Age:  21w 1d                  CI:         65.75  %    70 - 86
FL/HC:       18.4  %    15.9 -
HC:      197.9  mm     G. Age:  22w 0d         80  %    HC/AC:       1.16       1.06 -
AC:      170.5  mm     G. Age:  22w 0d         76  %    FL/BPD:      72.8  %
FL:       36.4  mm     G. Age:  21w 4d         60  %    FL/AC:       21.3  %    20 - 24
HUM:        35  mm     G. Age:  22w 0d         73  %
CER:      23.6  mm     G. Age:  21w 6d         74  %
LV:        6.1  mm
CM:        5.9  mm

Est. FW:     452   gm          1 lb     57  %
Gestational Age

LMP:           21w 0d        Date:  01/24/15                 EDD:    10/31/15
U/S Today:     21w 5d                                        EDD:    10/26/15
Best:          21w 0d     Det. By:  LMP  (01/24/15)          EDD:    10/31/15
Anatomy

Cranium:          Appears normal         LVOT:             Appears normal
Fetal Cavum:      Appears normal         Aortic Arch:      Appears normal
Ventricles:       Appears normal         Ductal Arch:      Appears normal
Choroid Plexus:   Right CPC -            Diaphragm:        Appears normal
resolving
Cerebellum:       Appears normal         Stomach:          Appears normal, left
sided
Posterior Fossa:  Appears normal         Abdomen:          Appears normal
Nuchal Fold:      Not applicable (>20    Abdominal Wall:   Appears nml (cord
wks GA)                                  insert, abd wall)
Face:             Appears normal         Cord Vessels:     Appears normal (3
(orbits and profile)                     vessel cord)
Lips:             Appears normal         Kidneys:          Left UTD 4.8 mm
Palate:           Appears normal         Bladder:          Appears normal
Fetal Thoracic:   Appears normal         Spine:            Appears normal
Heart:            Appears normal         Upper             Appears normal
(4CH, axis, and        Extremities:
situs)
RVOT:             Appears normal         Lower             Appears normal
Extremities:

Other:  Heels and Lt 5th digit previously seen. Fetus appears to be a male.
Cervix Uterus Adnexa

Cervix
Length:           3.81  cm.
Normal appearance by transabdominal scan. Appears closed, without
funnelling.

Left Ovary
Not visualized. No adnexal mass visualized.

Right Ovary
Within normal limits.
Impression

SIUP at 21+0 weeks
Small, resolving CP cyst; left UTD A1 (mild pyelectasis)
All other detailed fetal anatomy was seen and appeared
normal; spine seen and appeared normal
Normal amniotic fluid volume
Measurements consistent with LMP dating

The US findings were shared with Ms. Quinillo. The implications
of CP cysts and pyelectasis were discussed in detail.
Recommendations

Follow-up ultrasound in 
 32 weeks to reassess kidneys
(renal pelvis < 7 mms normal); please refer back if > 7 mms

## 2017-12-15 ENCOUNTER — Ambulatory Visit (HOSPITAL_COMMUNITY)
Admission: EM | Admit: 2017-12-15 | Discharge: 2017-12-15 | Discharge status: Against Medical Advice | Payer: Medicaid Other

## 2017-12-15 NOTE — ED Notes (Signed)
Attempted to call patient for triage with no answer.  

## 2017-12-15 NOTE — ED Notes (Signed)
Called several times for room no answer

## 2018-05-23 ENCOUNTER — Other Ambulatory Visit: Payer: Self-pay | Admitting: Radiology

## 2018-05-23 DIAGNOSIS — N6320 Unspecified lump in the left breast, unspecified quadrant: Secondary | ICD-10-CM

## 2018-06-06 ENCOUNTER — Ambulatory Visit
Admission: RE | Admit: 2018-06-06 | Discharge: 2018-06-06 | Disposition: A | Discharge status: Home / Self Care | Payer: No Typology Code available for payment source | Source: Ambulatory Visit | Attending: Radiology | Admitting: Radiology

## 2018-06-06 DIAGNOSIS — N6320 Unspecified lump in the left breast, unspecified quadrant: Secondary | ICD-10-CM

## 2019-02-28 ENCOUNTER — Other Ambulatory Visit (HOSPITAL_COMMUNITY)
Admission: RE | Admit: 2019-02-28 | Discharge: 2019-02-28 | Disposition: A | Discharge status: Home / Self Care | Payer: BC Managed Care – PPO | Source: Ambulatory Visit | Attending: Obstetrics & Gynecology | Admitting: Obstetrics & Gynecology

## 2019-02-28 DIAGNOSIS — Z20828 Contact with and (suspected) exposure to other viral communicable diseases: Secondary | ICD-10-CM | POA: Insufficient documentation

## 2019-02-28 DIAGNOSIS — Z01812 Encounter for preprocedural laboratory examination: Secondary | ICD-10-CM | POA: Insufficient documentation

## 2019-03-01 LAB — NOVEL CORONAVIRUS, NAA (HOSP ORDER, SEND-OUT TO REF LAB; TAT 18-24 HRS): SARS-CoV-2, NAA: NOT DETECTED

## 2019-03-01 NOTE — H&P (Signed)
[name] [name] is an 28 y.o. female with right abdominal mass x 8 months.  Patient presented initially with c/o RLQ pain which felt different from her previous ovarian cysts.  On exam, approximately 1 cm density is palpated at the location of the patient's pain underlying Pfannenstiel scar (C/S 10/2015).  U/S shows 9.8 x 11 x 8 mm hypoechoic mass with internal echoes; no flow seen to mass.    Pertinent Gynecological History: Menses: flow is light Bleeding: regular Contraception: OCP (estrogen/progesterone) DES exposure: unknown Blood transfusions: none Sexually transmitted diseases: no past history Previous GYN Procedures: DNC and and C/S  Last mammogram: n/a Date: n/a Last pap: normal Date: 10/2018 OB History: G2, P1011   Menstrual History: Menarche age: n/a No LMP recorded.    Past Medical History:  Diagnosis Date  . No pertinent past medical history   . Vaginal Pap smear, abnormal     Past Surgical History:  Procedure Laterality Date  . CESAREAN SECTION N/A 11/01/2015   Procedure: CESAREAN SECTION;  Surgeon: Everlene Farrier, MD;  Location: [facility];  Service: Obstetrics;  Laterality: N/A;  . COLPOSCOPY VULVA W/ BIOPSY    . DILATION AND EVACUATION N/A 10/01/2014   Procedure: DILATATION AND EVACUATION;  Surgeon: Linda Hedges, DO;  Location: [facility];  Service: Gynecology;  Laterality: N/A;  . MOUTH SURGERY      Family History  Problem Relation Age of Onset  . Diabetes Maternal Grandfather   . Diabetes Paternal Grandmother   . Heart disease Paternal Grandmother     Social History:  reports that she quit smoking about 4 years ago. She has never used smokeless tobacco. She reports that she does not drink alcohol or use drugs.  Allergies: No Known Allergies  No medications prior to admission.    ROS  unknown if currently breastfeeding. Physical Exam  Constitutional: She is oriented to person, place, and time. She appears well-developed and well-nourished.   Cardiovascular: Normal rate and regular rhythm.  Respiratory: Effort normal and breath sounds normal.  GI: Soft. She exhibits mass. There is abdominal tenderness. There is no rebound.  Neurological: She is alert and oriented to person, place, and time.  Skin: Skin is warm and dry.  Psychiatric: She has a normal mood and affect. Her behavior is normal.    No results found for this or any previous visit (from the past 24 hour(s)).  No results found.  Assessment/Plan: 28yo with painful incisional mass -Removal of incisional mass-patient has been counseled re: risk of bleeding, infection and scarring.  All questions were answered and the patient wishes to proceed.  Linda Hedges 03/01/2019, 8:45 AM

## 2019-03-02 ENCOUNTER — Encounter (HOSPITAL_BASED_OUTPATIENT_CLINIC_OR_DEPARTMENT_OTHER): Payer: Self-pay | Admitting: *Deleted

## 2019-03-02 ENCOUNTER — Other Ambulatory Visit: Payer: Self-pay

## 2019-03-02 NOTE — Progress Notes (Signed)
Spoke with patient via telephone for pre op interview. NPO after MN. No medications AM of surgery. Patient will need UPT, T&S and CBC AM of surgery. Arrival time 0530.

## 2019-03-02 NOTE — Anesthesia Preprocedure Evaluation (Addendum)
Anesthesia Evaluation  Patient identified by MRN, date of birth, ID band Patient awake    Reviewed: Allergy & Precautions, NPO status , Patient's Chart, lab work & pertinent test results  History of Anesthesia Complications Negative for: history of anesthetic complications  Airway Mallampati: I       Dental no notable dental hx. (+) Chipped, Teeth Intact,    Pulmonary neg pulmonary ROS, former smoker,    breath sounds clear to auscultation       Cardiovascular negative cardio ROS Normal cardiovascular exam Rhythm:Regular Rate:Normal     Neuro/Psych negative neurological ROS  negative psych ROS   GI/Hepatic negative GI ROS, Neg liver ROS,   Endo/Other  negative endocrine ROS  Renal/GU negative Renal ROS  negative genitourinary   Musculoskeletal negative musculoskeletal ROS (+)   Abdominal Normal abdominal exam  (+)   Peds  Hematology negative hematology ROS (+)   Anesthesia Other Findings   Reproductive/Obstetrics negative OB ROS                            Anesthesia Physical  Anesthesia Plan  ASA: I  Anesthesia Plan: General   Post-op Pain Management:    Induction: Intravenous  PONV Risk Score and Plan: 3 and Dexamethasone, Scopolamine patch - Pre-op and Ondansetron  Airway Management Planned: LMA  Additional Equipment: None  Intra-op Plan:   Post-operative Plan: Extubation in OR  Informed Consent: I have reviewed the patients History and Physical, chart, labs and discussed the procedure including the risks, benefits and alternatives for the proposed anesthesia with the patient or authorized representative who has indicated his/her understanding and acceptance.     Dental advisory given  Plan Discussed with: CRNA and Surgeon  Anesthesia Plan Comments:        Anesthesia Quick Evaluation

## 2019-03-03 ENCOUNTER — Encounter (HOSPITAL_BASED_OUTPATIENT_CLINIC_OR_DEPARTMENT_OTHER)
Admission: RE | Disposition: A | Discharge status: Home / Self Care | Payer: Self-pay | Source: Home / Self Care | Attending: Obstetrics & Gynecology

## 2019-03-03 ENCOUNTER — Encounter (HOSPITAL_BASED_OUTPATIENT_CLINIC_OR_DEPARTMENT_OTHER): Payer: Self-pay | Admitting: General Practice

## 2019-03-03 ENCOUNTER — Ambulatory Visit (HOSPITAL_BASED_OUTPATIENT_CLINIC_OR_DEPARTMENT_OTHER): Payer: BC Managed Care – PPO | Admitting: Anesthesiology

## 2019-03-03 ENCOUNTER — Ambulatory Visit (HOSPITAL_BASED_OUTPATIENT_CLINIC_OR_DEPARTMENT_OTHER)
Admission: RE | Admit: 2019-03-03 | Discharge: 2019-03-03 | Disposition: A | Discharge status: Home / Self Care | Payer: BC Managed Care – PPO | Attending: Obstetrics & Gynecology | Admitting: Obstetrics & Gynecology

## 2019-03-03 DIAGNOSIS — Z87891 Personal history of nicotine dependence: Secondary | ICD-10-CM | POA: Diagnosis not present

## 2019-03-03 DIAGNOSIS — R222 Localized swelling, mass and lump, trunk: Secondary | ICD-10-CM | POA: Insufficient documentation

## 2019-03-03 DIAGNOSIS — T8189XA Other complications of procedures, not elsewhere classified, initial encounter: Secondary | ICD-10-CM

## 2019-03-03 HISTORY — PX: LAPAROTOMY: SHX154

## 2019-03-03 LAB — TYPE AND SCREEN
ABO/RH(D): O POS
Antibody Screen: NEGATIVE

## 2019-03-03 LAB — CBC
HCT: 38.8 % (ref 36.0–46.0)
Hemoglobin: 13 g/dL (ref 12.0–15.0)
MCH: 29.5 pg (ref 26.0–34.0)
MCHC: 33.5 g/dL (ref 30.0–36.0)
MCV: 88.2 fL (ref 80.0–100.0)
Platelets: 309 10*3/uL (ref 150–400)
RBC: 4.4 MIL/uL (ref 3.87–5.11)
RDW: 12.2 % (ref 11.5–15.5)
WBC: 7.8 10*3/uL (ref 4.0–10.5)
nRBC: 0 % (ref 0.0–0.2)

## 2019-03-03 LAB — POCT PREGNANCY, URINE: Preg Test, Ur: NEGATIVE

## 2019-03-03 LAB — ABO/RH: ABO/RH(D): O POS

## 2019-03-03 SURGERY — LAPAROTOMY
Anesthesia: General | Site: Abdomen

## 2019-03-03 MED ORDER — OXYCODONE HCL 5 MG PO TABS
5.0000 mg | ORAL_TABLET | Freq: Once | ORAL | Status: AC | PRN
  Administered 2019-03-03: 5 mg via ORAL
  Filled 2019-03-03: qty 1

## 2019-03-03 MED ORDER — CEFAZOLIN SODIUM-DEXTROSE 2-4 GM/100ML-% IV SOLN
2.0000 g | INTRAVENOUS | Status: AC
  Administered 2019-03-03: 2 g via INTRAVENOUS
  Filled 2019-03-03: qty 100

## 2019-03-03 MED ORDER — OXYCODONE-ACETAMINOPHEN 5-325 MG PO TABS: 1.0000 | ORAL_TABLET | ORAL | 0 refills | Status: DC | PRN

## 2019-03-03 MED ORDER — CEFAZOLIN SODIUM-DEXTROSE 2-4 GM/100ML-% IV SOLN
INTRAVENOUS | Status: AC
  Filled 2019-03-03: qty 100

## 2019-03-03 MED ORDER — SCOPOLAMINE 1 MG/3DAYS TD PT72
MEDICATED_PATCH | TRANSDERMAL | Status: AC
  Filled 2019-03-03: qty 1

## 2019-03-03 MED ORDER — ONDANSETRON HCL 4 MG/2ML IJ SOLN
INTRAMUSCULAR | Status: DC | PRN
  Administered 2019-03-03: 4 mg via INTRAVENOUS

## 2019-03-03 MED ORDER — LIDOCAINE 2% (20 MG/ML) 5 ML SYRINGE
INTRAMUSCULAR | Status: AC
  Filled 2019-03-03: qty 5

## 2019-03-03 MED ORDER — ACETAMINOPHEN 500 MG PO TABS
ORAL_TABLET | ORAL | Status: AC
  Filled 2019-03-03: qty 2

## 2019-03-03 MED ORDER — MIDAZOLAM HCL 2 MG/2ML IJ SOLN
INTRAMUSCULAR | Status: DC | PRN
  Administered 2019-03-03: 2 mg via INTRAVENOUS

## 2019-03-03 MED ORDER — LACTATED RINGERS IV SOLN
INTRAVENOUS | Status: DC
  Administered 2019-03-03: 07:00:00 via INTRAVENOUS
  Filled 2019-03-03: qty 1000

## 2019-03-03 MED ORDER — ACETAMINOPHEN 325 MG PO TABS
ORAL_TABLET | ORAL | Status: DC | PRN
  Administered 2019-03-03: 1000 mg via ORAL

## 2019-03-03 MED ORDER — ONDANSETRON HCL 4 MG/2ML IJ SOLN
INTRAMUSCULAR | Status: AC
  Filled 2019-03-03: qty 2

## 2019-03-03 MED ORDER — LIDOCAINE 2% (20 MG/ML) 5 ML SYRINGE
INTRAMUSCULAR | Status: DC | PRN
  Administered 2019-03-03: 100 mg via INTRAVENOUS

## 2019-03-03 MED ORDER — DEXAMETHASONE SODIUM PHOSPHATE 10 MG/ML IJ SOLN
INTRAMUSCULAR | Status: AC
  Filled 2019-03-03: qty 1

## 2019-03-03 MED ORDER — OXYCODONE HCL 5 MG/5ML PO SOLN
5.0000 mg | Freq: Once | ORAL | Status: AC | PRN
  Filled 2019-03-03: qty 5

## 2019-03-03 MED ORDER — PROPOFOL 10 MG/ML IV BOLUS
INTRAVENOUS | Status: DC | PRN
  Administered 2019-03-03: 40 mg via INTRAVENOUS
  Administered 2019-03-03: 90 mg via INTRAVENOUS

## 2019-03-03 MED ORDER — ONDANSETRON HCL 4 MG/2ML IJ SOLN
4.0000 mg | Freq: Once | INTRAMUSCULAR | Status: DC | PRN
  Filled 2019-03-03: qty 2

## 2019-03-03 MED ORDER — LACTATED RINGERS IV SOLN
INTRAVENOUS | Status: DC
  Filled 2019-03-03: qty 1000

## 2019-03-03 MED ORDER — FENTANYL CITRATE (PF) 100 MCG/2ML IJ SOLN
INTRAMUSCULAR | Status: AC
  Filled 2019-03-03: qty 2

## 2019-03-03 MED ORDER — ACETAMINOPHEN 160 MG/5ML PO SOLN
325.0000 mg | ORAL | Status: DC | PRN
  Filled 2019-03-03: qty 20.3

## 2019-03-03 MED ORDER — ROCURONIUM BROMIDE 10 MG/ML (PF) SYRINGE
PREFILLED_SYRINGE | INTRAVENOUS | Status: AC
  Filled 2019-03-03: qty 10

## 2019-03-03 MED ORDER — IBUPROFEN 800 MG PO TABS: 800.0000 mg | ORAL_TABLET | Freq: Three times a day (TID) | ORAL | 0 refills | Status: DC | PRN

## 2019-03-03 MED ORDER — FENTANYL CITRATE (PF) 100 MCG/2ML IJ SOLN
25.0000 ug | INTRAMUSCULAR | Status: DC | PRN
  Administered 2019-03-03: 25 ug via INTRAVENOUS
  Filled 2019-03-03: qty 1

## 2019-03-03 MED ORDER — KETOROLAC TROMETHAMINE 30 MG/ML IJ SOLN
INTRAMUSCULAR | Status: AC
  Filled 2019-03-03: qty 1

## 2019-03-03 MED ORDER — FENTANYL CITRATE (PF) 100 MCG/2ML IJ SOLN
INTRAMUSCULAR | Status: AC
  Filled 2019-03-03: qty 4

## 2019-03-03 MED ORDER — DEXAMETHASONE SODIUM PHOSPHATE 10 MG/ML IJ SOLN
INTRAMUSCULAR | Status: DC | PRN
  Administered 2019-03-03: 5 mg via INTRAVENOUS

## 2019-03-03 MED ORDER — ARTIFICIAL TEARS OPHTHALMIC OINT
TOPICAL_OINTMENT | OPHTHALMIC | Status: AC
  Filled 2019-03-03: qty 3.5

## 2019-03-03 MED ORDER — WHITE PETROLATUM EX OINT
TOPICAL_OINTMENT | CUTANEOUS | Status: AC
  Filled 2019-03-03: qty 5

## 2019-03-03 MED ORDER — KETOROLAC TROMETHAMINE 30 MG/ML IJ SOLN
INTRAMUSCULAR | Status: DC | PRN
  Administered 2019-03-03: 30 mg via INTRAVENOUS

## 2019-03-03 MED ORDER — MEPERIDINE HCL 25 MG/ML IJ SOLN
6.2500 mg | INTRAMUSCULAR | Status: DC | PRN
  Filled 2019-03-03: qty 1

## 2019-03-03 MED ORDER — ACETAMINOPHEN 325 MG PO TABS
325.0000 mg | ORAL_TABLET | ORAL | Status: DC | PRN
  Filled 2019-03-03: qty 2

## 2019-03-03 MED ORDER — PROPOFOL 10 MG/ML IV BOLUS
INTRAVENOUS | Status: AC
  Filled 2019-03-03: qty 40

## 2019-03-03 MED ORDER — OXYCODONE HCL 5 MG PO TABS
ORAL_TABLET | ORAL | Status: AC
  Filled 2019-03-03: qty 1

## 2019-03-03 MED ORDER — FENTANYL CITRATE (PF) 100 MCG/2ML IJ SOLN
INTRAMUSCULAR | Status: DC | PRN
  Administered 2019-03-03 (×2): 50 ug via INTRAVENOUS

## 2019-03-03 MED ORDER — MIDAZOLAM HCL 2 MG/2ML IJ SOLN
INTRAMUSCULAR | Status: AC
  Filled 2019-03-03: qty 2

## 2019-03-03 MED ORDER — KETOROLAC TROMETHAMINE 30 MG/ML IJ SOLN
30.0000 mg | Freq: Once | INTRAMUSCULAR | Status: DC | PRN
  Filled 2019-03-03: qty 1

## 2019-03-03 MED ORDER — SCOPOLAMINE 1 MG/3DAYS TD PT72
MEDICATED_PATCH | TRANSDERMAL | Status: DC | PRN
  Administered 2019-03-03: 1 via TRANSDERMAL

## 2019-03-03 SURGICAL SUPPLY — 36 items
APL PRP STRL LF DISP 70% ISPRP (MISCELLANEOUS)
APL SKNCLS STERI-STRIP NONHPOA (GAUZE/BANDAGES/DRESSINGS) ×1
BENZOIN TINCTURE PRP APPL 2/3 (GAUZE/BANDAGES/DRESSINGS) ×2 IMPLANT
BLADE CLIPPER SENSICLIP SURGIC (BLADE) ×1 IMPLANT
BLADE HEX COATED 2.75 (ELECTRODE) ×1 IMPLANT
CANISTER SUCT 3000ML PPV (MISCELLANEOUS) ×3 IMPLANT
CHLORAPREP W/TINT 26 (MISCELLANEOUS) ×1 IMPLANT
CLOSURE WOUND 1/2 X4 (GAUZE/BANDAGES/DRESSINGS) ×1
COVER MAYO STAND STRL (DRAPES) ×1 IMPLANT
COVER WAND RF STERILE (DRAPES) ×3 IMPLANT
DECANTER SPIKE VIAL GLASS SM (MISCELLANEOUS) IMPLANT
DRAPE LAPAROTOMY TRNSV 102X78 (DRAPES) ×1 IMPLANT
DRSG OPSITE POSTOP 3X4 (GAUZE/BANDAGES/DRESSINGS) ×2 IMPLANT
DRSG TEGADERM 4X4.75 (GAUZE/BANDAGES/DRESSINGS) IMPLANT
GAUZE SPONGE 4X4 12PLY STRL (GAUZE/BANDAGES/DRESSINGS) IMPLANT
GLOVE BIO SURGEON STRL SZ 6 (GLOVE) ×3 IMPLANT
GLOVE BIOGEL PI IND STRL 6 (GLOVE) ×2 IMPLANT
GLOVE BIOGEL PI IND STRL 7.0 (GLOVE) ×2 IMPLANT
GLOVE BIOGEL PI INDICATOR 6 (GLOVE) ×4
GLOVE BIOGEL PI INDICATOR 7.0 (GLOVE) ×4
GOWN STRL REUS W/TWL LRG LVL3 (GOWN DISPOSABLE) ×6 IMPLANT
KIT TURNOVER CYSTO (KITS) ×3 IMPLANT
MANIFOLD NEPTUNE II (INSTRUMENTS) ×2 IMPLANT
NDL HYPO 25X1 1.5 SAFETY (NEEDLE) IMPLANT
NEEDLE HYPO 25X1 1.5 SAFETY (NEEDLE) IMPLANT
NS IRRIG 500ML POUR BTL (IV SOLUTION) ×3 IMPLANT
PACK ABDOMINAL GYN (CUSTOM PROCEDURE TRAY) IMPLANT
PAD OB MATERNITY 4.3X12.25 (PERSONAL CARE ITEMS) ×3 IMPLANT
SPONGE LAP 4X18 RFD (DISPOSABLE) IMPLANT
STRIP CLOSURE SKIN 1/2X4 (GAUZE/BANDAGES/DRESSINGS) ×1 IMPLANT
SUT PLAIN 3 0 FS 2 27 (SUTURE) ×2 IMPLANT
SUT PLAIN 4 0 FS 2 27 (SUTURE) IMPLANT
SUT VIC AB 0 CT1 36 (SUTURE) IMPLANT
SUT VIC AB 4-0 KS 27 (SUTURE) ×2 IMPLANT
TOWEL OR 17X26 10 PK STRL BLUE (TOWEL DISPOSABLE) ×6 IMPLANT
WATER STERILE IRR 500ML POUR (IV SOLUTION) ×3 IMPLANT

## 2019-03-03 NOTE — Progress Notes (Signed)
No change to H&P.  [name] Brix, DO 

## 2019-03-03 NOTE — Anesthesia Procedure Notes (Signed)
Procedure Name: LMA Insertion Date/Time: 03/03/2019 7:37 AM Performed by: Wanita Chamberlain, CRNA Pre-anesthesia Checklist: Patient identified, Emergency Drugs available, Suction available and Patient being monitored Patient Re-evaluated:Patient Re-evaluated prior to induction Oxygen Delivery Method: Circle system utilized Preoxygenation: Pre-oxygenation with 100% oxygen Induction Type: IV induction Ventilation: Mask ventilation without difficulty LMA: LMA inserted LMA Size: 4.0 Number of attempts: 1 Placement Confirmation: breath sounds checked- equal and bilateral,  CO2 detector and positive ETCO2 Tube secured with: Tape Dental Injury: Teeth and Oropharynx as per pre-operative assessment

## 2019-03-03 NOTE — Transfer of Care (Signed)
Immediate Anesthesia Transfer of Care Note  Patient: [name] [name]  Procedure(s) Performed: removal of right abdominal incisional mass by mini-laparotomy (N/A Abdomen)  Patient Location: PACU  Anesthesia Type:General  Level of Consciousness: awake, alert , oriented and patient cooperative  Airway & Oxygen Therapy: Patient Spontanous Breathing and Patient connected to nasal cannula oxygen  Post-op Assessment: Report given to RN and Post -op Vital signs reviewed and stable  Post vital signs: Reviewed and stable  Last Vitals:  Vitals Value Taken Time  BP    Temp    Pulse 82 03/03/19 0819  Resp 12 03/03/19 0819  SpO2 100 % 03/03/19 0819  Vitals shown include unvalidated device data.  Last Pain:  Vitals:   03/03/19 0618  TempSrc: Oral  PainSc: 0-No pain         Complications: No apparent anesthesia complications

## 2019-03-03 NOTE — Anesthesia Postprocedure Evaluation (Signed)
Anesthesia Post Note  Patient: [name] [name]  Procedure(s) Performed: removal of right abdominal incisional mass by mini-laparotomy (N/A Abdomen)     Patient location during evaluation: PACU Anesthesia Type: General Level of consciousness: awake Pain management: pain level controlled Vital Signs Assessment: post-procedure vital signs reviewed and stable Respiratory status: spontaneous breathing Cardiovascular status: stable Postop Assessment: no apparent nausea or vomiting Anesthetic complications: no    Last Vitals:  Vitals:   03/03/19 0915 03/03/19 1005  BP: 117/78 120/79  Pulse: 77 78  Resp: 18   Temp: 36.6 C 36.7 C  SpO2: 100% 99%    Last Pain:  Vitals:   03/03/19 1005  TempSrc:   PainSc: 4    Pain Goal: Patients Stated Pain Goal: 3 (03/03/19 0816)                 Huston Foley

## 2019-03-03 NOTE — Discharge Instructions (Signed)
°  Post Anesthesia Home Care Instructions  Activity: Get plenty of rest for the remainder of the day. A responsible adult should stay with you for 24 hours following the procedure.  For the next 24 hours, DO NOT: -Drive a car -Paediatric nurse -Drink alcoholic beverages -Take any medication unless instructed by your physician -Make any legal decisions or sign important papers.  Meals: Start with liquid foods such as gelatin or soup. Progress to regular foods as tolerated. Avoid greasy, spicy, heavy foods. If nausea and/or vomiting occur, drink only clear liquids until the nausea and/or vomiting subsides. Call your physician if vomiting continues.  Special Instructions/Symptoms: Your throat may feel dry or sore from the anesthesia or the breathing tube placed in your throat during surgery. If this causes discomfort, gargle with warm salt water. The discomfort should disappear within 24 hours.  If you had a scopolamine patch placed behind your ear for the management of post- operative nausea and/or vomiting:  1. The medication in the patch is effective for 72 hours, after which it should be removed.  Wrap patch in a tissue and discard in the trash. Wash hands thoroughly with soap and water. 2. You may remove the patch earlier than 72 hours if you experience unpleasant side effects which may include dry mouth, dizziness or visual disturbances. 3. Avoid touching the patch. Wash your hands with soap and water after contact with the patch.   Call MD for T>100.4, severe pain, or respiratory distress.  Call office to schedule postop incision check in 2 weeks.  No driving while taking narcotics.      Call your surgeon if you experience:   1.  Fever over 101.0. 2.  Inability to urinate. 3.  Nausea and/or vomiting. 4.  Extreme swelling or bruising at the surgical site. 5.  Continued bleeding from the incision. 6.  Increased pain, redness or drainage from the incision. 7.  Problems related to  your pain medication. 8.  Any problems and/or concerns

## 2019-03-03 NOTE — Op Note (Signed)
[name] DOB 1991/03/15  PROCEDURE DATE: 03/03/2019  PREOPERATIVE DIAGNOSIS:Painful right sided incisional mass  POSTOPERATIVE DIAGNOSIS:The same  PROCEDURE:Removal of incisional mass  SURGEON: Dr.Thom Ollinger  [name] [name] is a 51 yowith an increasingly painful and palpable mass at the right aspect of her Pfannenstiel. Patient desires surgical treatment. The risks of surgery werediscussed with the patient includingbut not limited to: bleeding which may require transfusion or reoperation; infection which may require antibiotics; injury to bowel, bladder, or other surrounding organs; need for additional procedures,incisional problems, thromboembolic phenomenon and other postoperative/anesthesia complications. The patient concurred with the proposed plan, giving informed written consent for the procedure.   FINDINGS:Supra-fascial fibrous density (~1 cm); well circumscribed .  ANESTHESIA:LMA ESTIMATED BLOOD LOSS:15 mLml SPECIMENS:Incisional mass COMPLICATIONS: None immediate  PROCEDURE IN DETAIL: The patient received intravenous antibiotics and had sequential compression devices applied to her lower extremities while in the preoperative area. [place] taken to the operating roomwhere general anesthesia was administered. The patient was sterilely prepped and draped in the normal fashion.  After an adequate timeout was performed,skin incision was made with scalpel overlying the palpable right sided mass. Using sharp and blunt dissection, a well circumscribed 1 cm density was dissected out.Surrounding thickened tissue additionally was removed using Bovie cautery.Thefascial layer was not interrupted during the excision. Hemostasis was observed. The deep subcutaneous tissue was closed with plain gut suture in 3 interrupted stitches.The skin was closed with 4-0 Vicryl in a subcuticular fashion.  Pt tolerated the procedure will. All counts were  correct x2. Pt went to the recovery room in stable condition.

## 2019-03-06 ENCOUNTER — Encounter (HOSPITAL_BASED_OUTPATIENT_CLINIC_OR_DEPARTMENT_OTHER): Payer: Self-pay | Admitting: Obstetrics & Gynecology

## 2020-11-26 IMAGING — US ULTRASOUND LEFT BREAST LIMITED
1 series · 7 of 7 positions shown · non-contrast
Comparison: November 07, 2013

CLINICAL DATA: 27-year-old patient with persistent tenderness in
the 2 o'clock retroareolar left breast. Her physician palpates a
lump in this region. The patient had cysts visualized on ultrasound
in the retroareolar left breast 2 o'clock position in Sunday October, 2013.

EXAM:
ULTRASOUND OF THE LEFT BREAST

[Series 1: ultrasound left breast limited · 0.04mm/px · 7 of 7 slices shown]
[im 1/7]
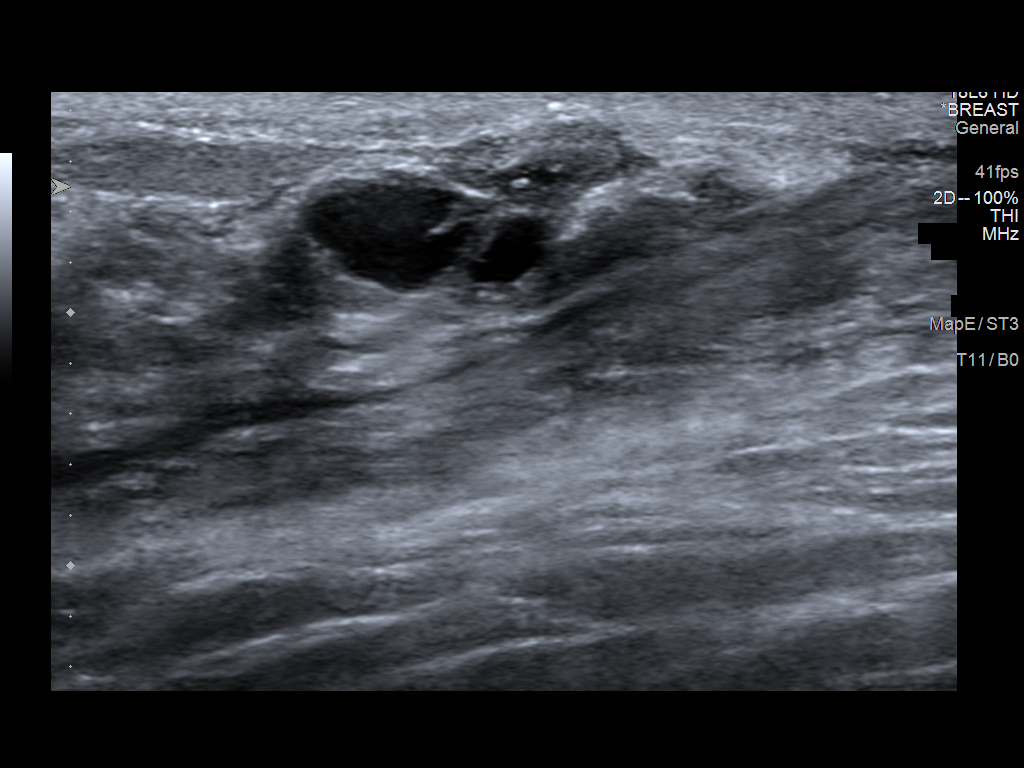
[im 2/7]
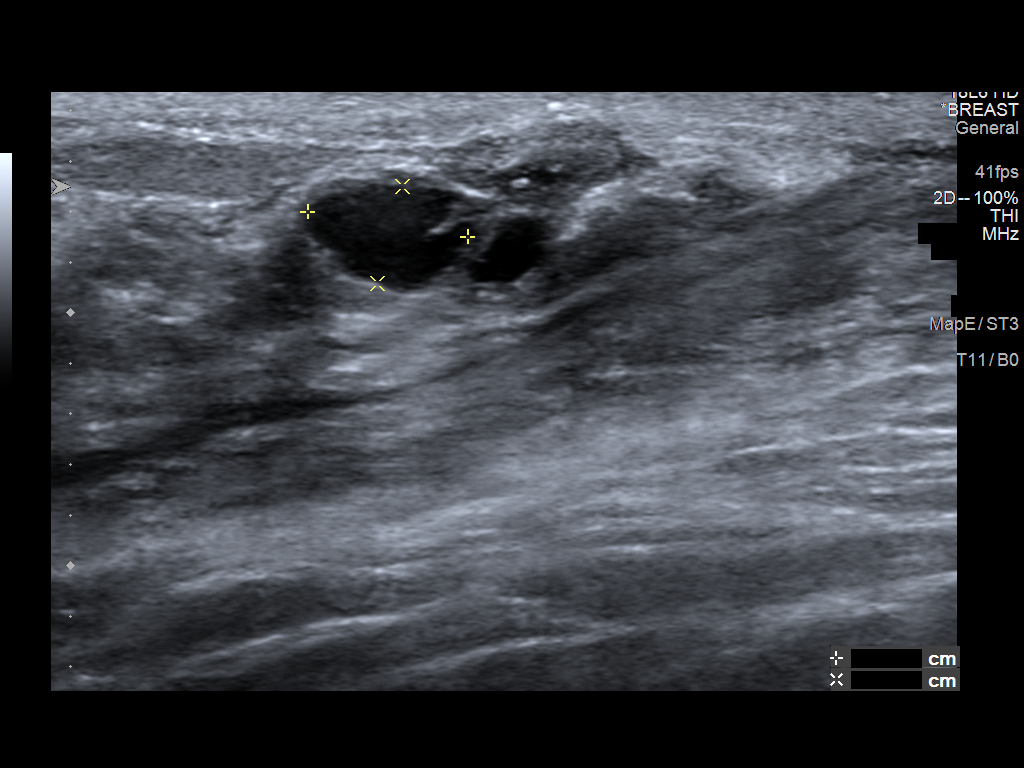
[im 3/7]
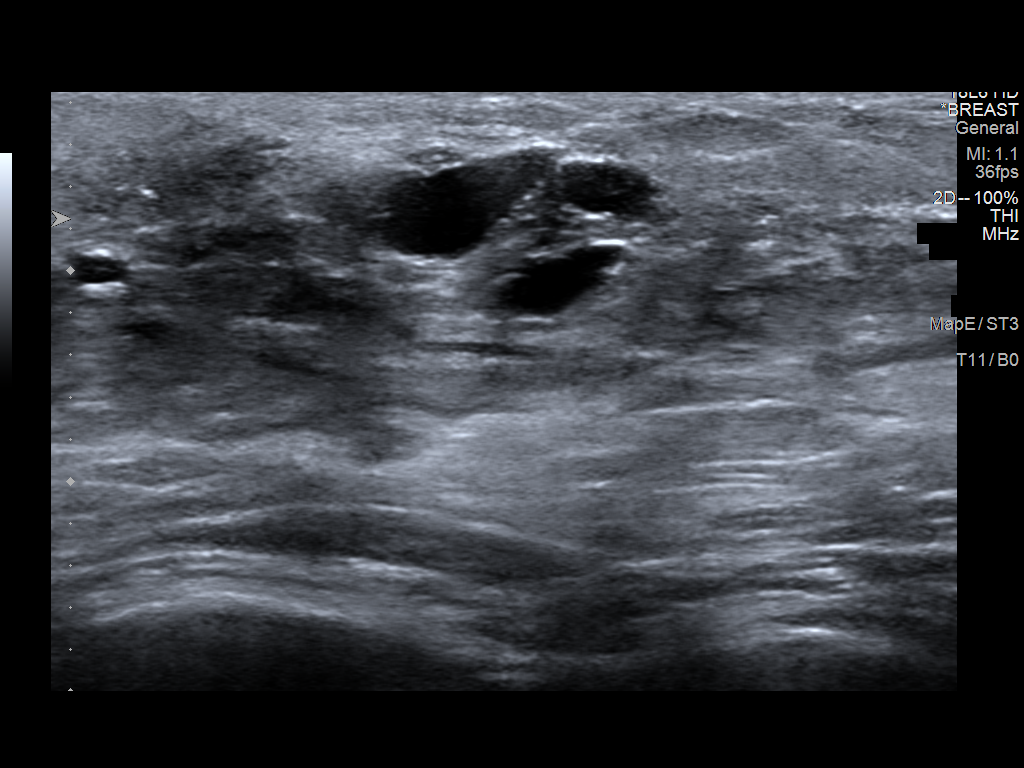
[im 4/7]
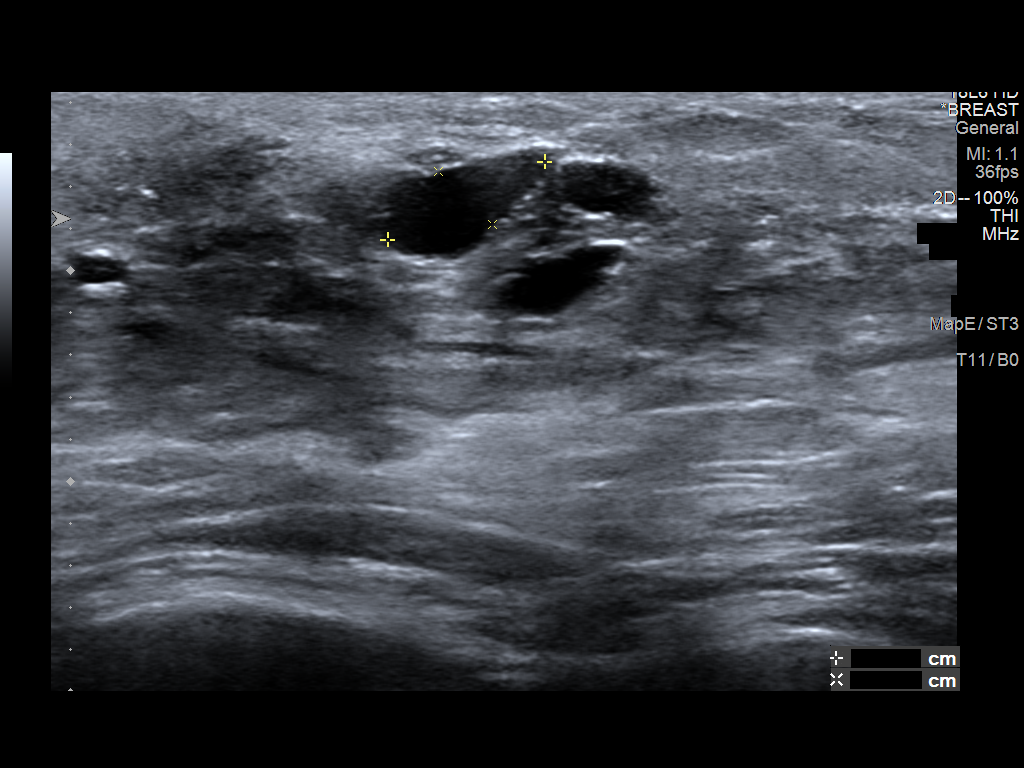
[im 5/7]
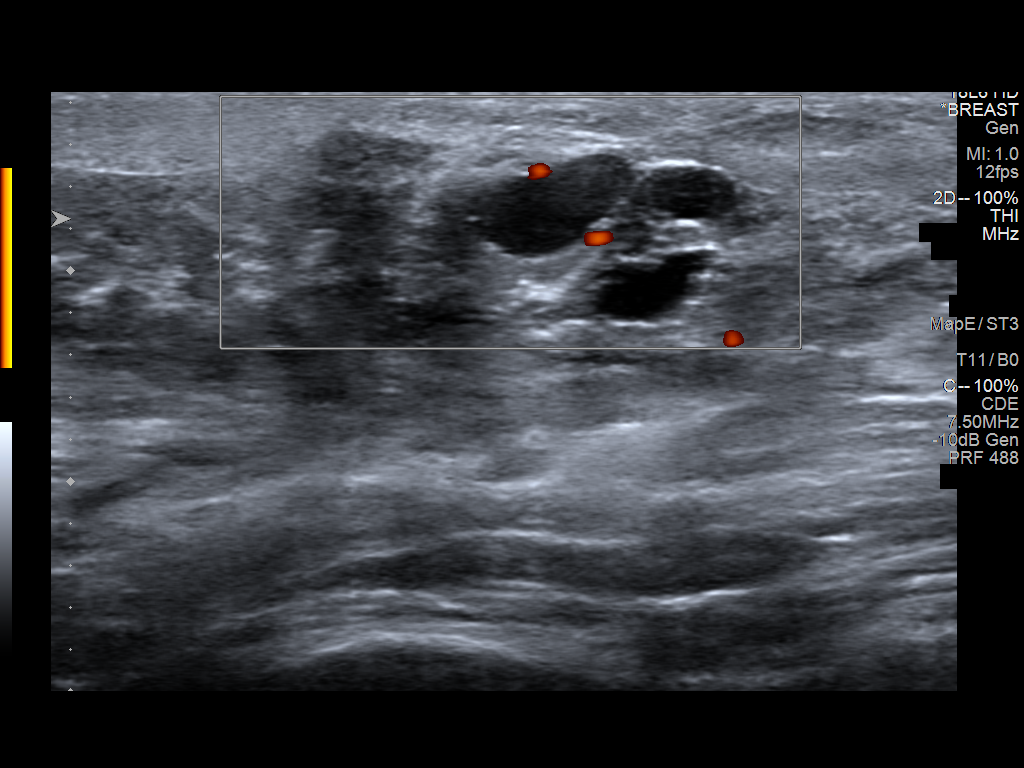
[im 6/7]
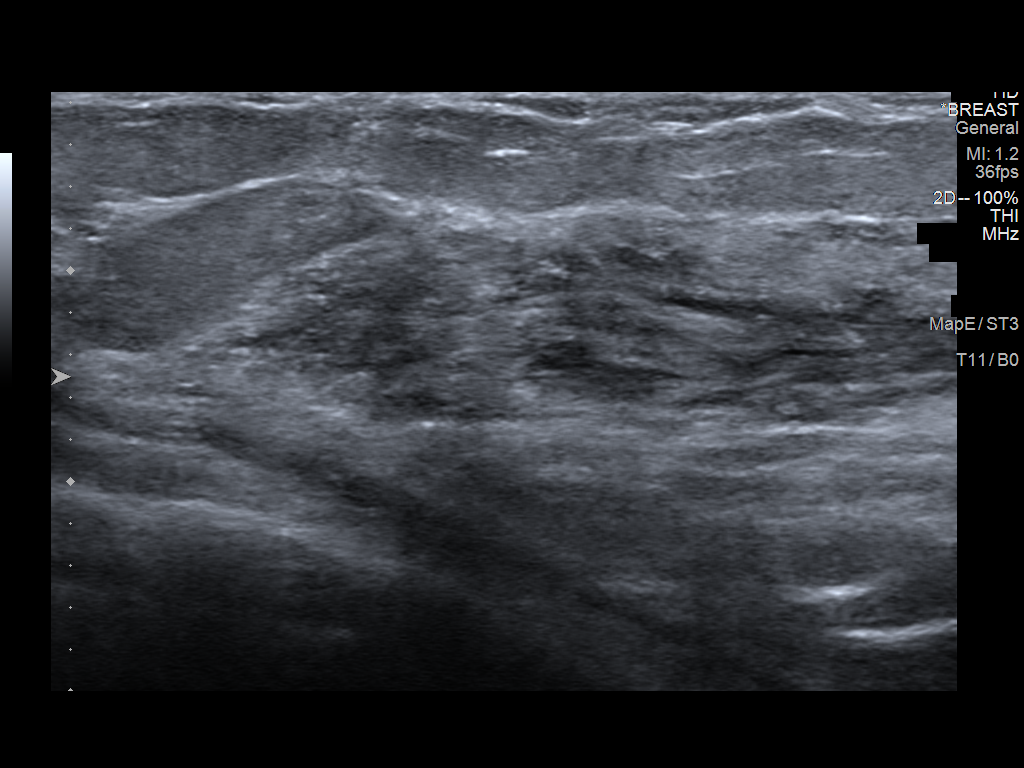
[im 7/7]
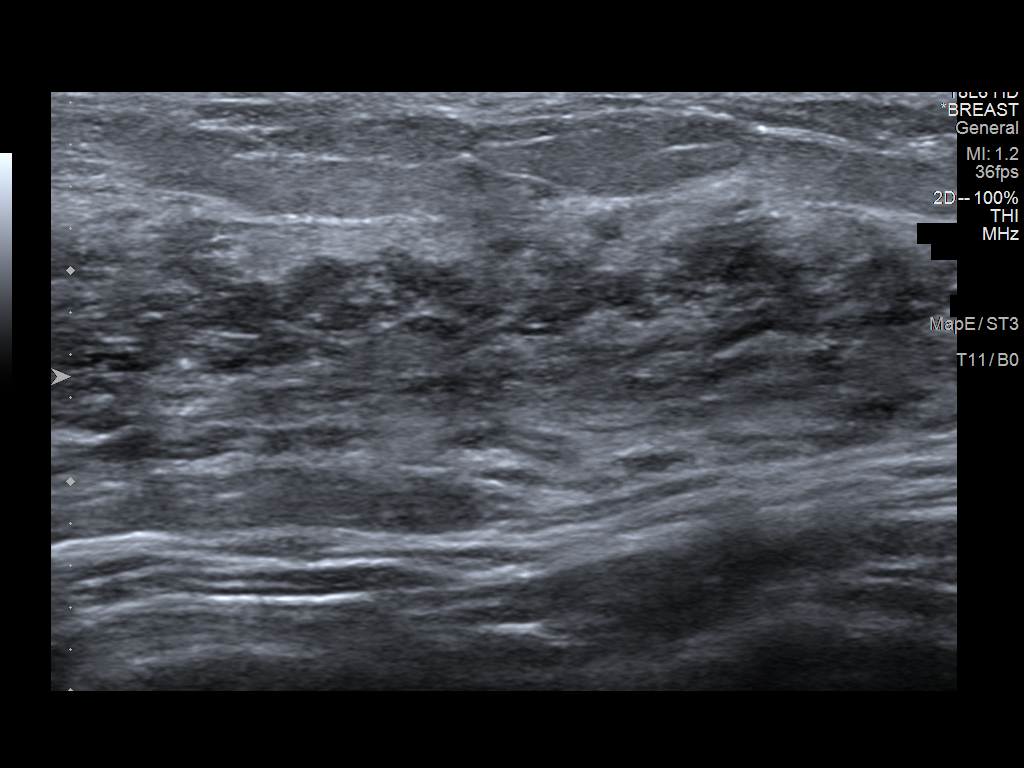

[7 of 7 positions shown; findings below may reference images not displayed]

FINDINGS: On physical exam, the skin of the left breast and nipple areolar
complex appears normal. I do not palpate a suspicious or definite
mass in the 2 o'clock position of the left breast retroareolar.

Targeted ultrasound is performed, showing 3 cysts immediately
adjacent to each other, the largest measuring 0.8 x 0.4 x 0.6 cm, at
2 o'clock position retroareolar. No internal vascular flow is
identified. No solid or suspicious mass.
IMPRESSION: Three adjacent cysts in the 2 o'clock region of the left breast
retroareolar, in the region of patient tenderness. No findings to
suggest malignancy. The largest cyst is 0.8 cm.

RECOMMENDATION:
Screening mammogram at age 39 unless there are persistent or
intervening clinical concerns. (Code:[id number])

I have discussed the findings and recommendations with the patient.
Results were also provided in writing at the conclusion of the
visit. If applicable, a reminder letter will be sent to the patient
regarding the next appointment.

BI-RADS CATEGORY  2: Benign.

## 2022-05-20 ENCOUNTER — Other Ambulatory Visit: Payer: Self-pay | Admitting: Obstetrics & Gynecology

## 2022-05-20 DIAGNOSIS — Z3141 Encounter for fertility testing: Secondary | ICD-10-CM

## 2022-08-07 ENCOUNTER — Encounter (HOSPITAL_BASED_OUTPATIENT_CLINIC_OR_DEPARTMENT_OTHER): Payer: Self-pay | Admitting: Obstetrics & Gynecology

## 2022-08-07 NOTE — Progress Notes (Signed)
08/07/2022 11:09 AM Spoke w/ via phone for pre-op interview---patient Lab needs dos----    urine pregnancy           Lab results------in Epic COVID test -----patient states asymptomatic no test needed Arrive at -------0530 NPO after MN NO Solid Food.  Clear liquids from MN until---0330 Med rec completed Medications to take morning of surgery -----none Diabetic medication -----na Patient instructed no nail polish to be worn day of surgery Patient instructed to bring photo id and insurance card day of surgery Patient aware to have Driver (ride ) / caregiver    for 24 hours after surgery (Husband Aijalon Greenhill [phone]) Patient Special Instructions ---none-- Pre-Op special Istructions -----none Patient verbalized understanding of instructions that were given at this phone interview. Patient denies shortness of breath, chest pain, fever, cough at this phone interview.  [name] [name], [name] [name]

## 2022-08-17 NOTE — H&P (Signed)
[name] [name] is an 32 y.o. female G71P1011 with pelvic pain, dysmenorrhea, infertility and hx of incisional endometriosis.  Patient is TTC and has declined hormonal treatment for this reason.    Pertinent Gynecological History: Menses: flow is excessive with use of 6 pads or tampons on heaviest days Bleeding: regular Contraception: none DES exposure: unknown Blood transfusions: none Sexually transmitted diseases: no past history Previous GYN Procedures: DNC and C/S, abdominal wall mass (endo) removal   Last mammogram:  n/a  Date: n/a Last pap: normal Date: 07/22/21 OB History: G2, P1011   Menstrual History: Menarche age: n/a Patient's last menstrual period was 07/21/2022 (approximate).    Past Medical History:  Diagnosis Date   Endometriosis    Heart murmur    pt. states as a child, resolved   No pertinent past medical history    Vaginal Pap smear, abnormal     Past Surgical History:  Procedure Laterality Date   CESAREAN SECTION N/A 11/01/2015   Procedure: CESAREAN SECTION;  Surgeon: Everlene Farrier, MD;  Location: [facility];  Service: Obstetrics;  Laterality: N/A;   COLPOSCOPY VULVA W/ BIOPSY     DILATION AND EVACUATION N/A 10/01/2014   Procedure: DILATATION AND EVACUATION;  Surgeon: Linda Hedges, DO;  Location: [facility];  Service: Gynecology;  Laterality: N/A;   LAPAROTOMY N/A 03/03/2019   Procedure: removal of right abdominal incisional mass;  Surgeon: Linda Hedges, DO;  Location: [facility];  Service: Gynecology;  Laterality: N/A;   MOUTH SURGERY     2009 wisdom teeth 32 yrs old    Family History  Problem Relation Age of Onset   Diabetes Maternal Grandfather    Diabetes Paternal Grandmother    Heart disease Paternal Grandmother     Social History:  reports that she quit smoking about 7 years ago. Her smoking use included cigarettes. She has never used smokeless tobacco. She reports current alcohol use. She reports that she does not use  drugs.  Allergies: No Known Allergies  No medications prior to admission.    Review of Systems  Height '4\' 11"'$  (1.499 m), weight 61.2 kg, last menstrual period 07/21/2022, unknown if currently breastfeeding. Physical Exam Constitutional:      Appearance: Normal appearance.  HENT:     Head: Normocephalic and atraumatic.  Pulmonary:     Effort: Pulmonary effort is normal.  Abdominal:     Palpations: Abdomen is soft.  Musculoskeletal:        General: Normal range of motion.     Cervical back: Normal range of motion.  Skin:    General: Skin is warm and dry.  Neurological:     Mental Status: She is alert and oriented to person, place, and time.  Psychiatric:        Mood and Affect: Mood normal.        Behavior: Behavior normal.     No results found for this or any previous visit (from the past 24 hour(s)).  No results found.  Assessment/Plan: 32yo G2P1 with dysmenorrhea and pelvic pain-patient is counseled that typically these symptoms are treated with hormonal medication for suppression. This is not an ideal option for her as she is TTC. I recommend Dx L/S with possible removal of endo and chromopertubation for dx and tx. She agrees. She is counseled re: risk of bleeding, infection, scarring and damage to surrounding structures. All questions were answered and we will proceed  Linda Hedges 08/17/2022, 9:57 AM

## 2022-08-20 NOTE — Anesthesia Preprocedure Evaluation (Addendum)
Anesthesia Evaluation  Patient identified by MRN, date of birth, ID band Patient awake    Reviewed: Allergy & Precautions, NPO status , Patient's Chart, lab work & pertinent test results  History of Anesthesia Complications Negative for: history of anesthetic complications  Airway Mallampati: I       Dental no notable dental hx. (+) Chipped, Teeth Intact,    Pulmonary former smoker   breath sounds clear to auscultation       Cardiovascular negative cardio ROS Normal cardiovascular exam Rhythm:Regular Rate:Normal     Neuro/Psych negative neurological ROS  negative psych ROS   GI/Hepatic negative GI ROS, Neg liver ROS,,,  Endo/Other  negative endocrine ROS    Renal/GU negative Renal ROS  negative genitourinary   Musculoskeletal negative musculoskeletal ROS (+)    Abdominal Normal abdominal exam  (+)   Peds  Hematology negative hematology ROS (+)   Anesthesia Other Findings   Reproductive/Obstetrics negative OB ROS                             Anesthesia Physical Anesthesia Plan  ASA: 1  Anesthesia Plan: General   Post-op Pain Management: Tylenol PO (pre-op)* and Celebrex PO (pre-op)*   Induction: Intravenous  PONV Risk Score and Plan: 3 and Dexamethasone and Ondansetron  Airway Management Planned: Oral ETT  Additional Equipment: None  Intra-op Plan:   Post-operative Plan: Extubation in OR  Informed Consent: I have reviewed the patients History and Physical, chart, labs and discussed the procedure including the risks, benefits and alternatives for the proposed anesthesia with the patient or authorized representative who has indicated his/her understanding and acceptance.     Dental advisory given  Plan Discussed with: CRNA and Anesthesiologist  Anesthesia Plan Comments: (  )        Anesthesia Quick Evaluation

## 2022-08-21 ENCOUNTER — Ambulatory Visit (HOSPITAL_BASED_OUTPATIENT_CLINIC_OR_DEPARTMENT_OTHER): Payer: Managed Care, Other (non HMO) | Admitting: Certified Registered Nurse Anesthetist

## 2022-08-21 ENCOUNTER — Ambulatory Visit (HOSPITAL_BASED_OUTPATIENT_CLINIC_OR_DEPARTMENT_OTHER)
Admission: RE | Admit: 2022-08-21 | Discharge: 2022-08-21 | Disposition: A | Discharge status: Home / Self Care | Payer: Managed Care, Other (non HMO) | Source: Ambulatory Visit | Attending: Obstetrics & Gynecology | Admitting: Obstetrics & Gynecology

## 2022-08-21 ENCOUNTER — Other Ambulatory Visit: Payer: Self-pay

## 2022-08-21 ENCOUNTER — Encounter (HOSPITAL_BASED_OUTPATIENT_CLINIC_OR_DEPARTMENT_OTHER): Payer: Self-pay | Admitting: Obstetrics & Gynecology

## 2022-08-21 ENCOUNTER — Encounter (HOSPITAL_BASED_OUTPATIENT_CLINIC_OR_DEPARTMENT_OTHER)
Admission: RE | Disposition: A | Discharge status: Home / Self Care | Payer: Self-pay | Source: Ambulatory Visit | Attending: Obstetrics & Gynecology

## 2022-08-21 DIAGNOSIS — N979 Female infertility, unspecified: Secondary | ICD-10-CM | POA: Insufficient documentation

## 2022-08-21 DIAGNOSIS — Z87891 Personal history of nicotine dependence: Secondary | ICD-10-CM | POA: Insufficient documentation

## 2022-08-21 DIAGNOSIS — R102 Pelvic and perineal pain: Secondary | ICD-10-CM | POA: Diagnosis present

## 2022-08-21 DIAGNOSIS — N809 Endometriosis, unspecified: Secondary | ICD-10-CM | POA: Diagnosis not present

## 2022-08-21 DIAGNOSIS — N941 Unspecified dyspareunia: Secondary | ICD-10-CM | POA: Insufficient documentation

## 2022-08-21 DIAGNOSIS — N803 Endometriosis of pelvic peritoneum, unspecified: Secondary | ICD-10-CM | POA: Diagnosis not present

## 2022-08-21 HISTORY — PX: LAPAROSCOPY: SHX197

## 2022-08-21 HISTORY — DX: Endometriosis, unspecified: N80.9

## 2022-08-21 HISTORY — PX: CHROMOPERTUBATION: SHX6288

## 2022-08-21 HISTORY — DX: Cardiac murmur, unspecified: R01.1

## 2022-08-21 LAB — POCT PREGNANCY, URINE: Preg Test, Ur: NEGATIVE

## 2022-08-21 LAB — TYPE AND SCREEN
ABO/RH(D): O POS
Antibody Screen: NEGATIVE

## 2022-08-21 LAB — CBC
HCT: 39.4 % (ref 36.0–46.0)
Hemoglobin: 12.8 g/dL (ref 12.0–15.0)
MCH: 28.9 pg (ref 26.0–34.0)
MCHC: 32.5 g/dL (ref 30.0–36.0)
MCV: 88.9 fL (ref 80.0–100.0)
Platelets: 281 10*3/uL (ref 150–400)
RBC: 4.43 MIL/uL (ref 3.87–5.11)
RDW: 11.9 % (ref 11.5–15.5)
WBC: 6.5 10*3/uL (ref 4.0–10.5)
nRBC: 0 % (ref 0.0–0.2)

## 2022-08-21 SURGERY — LAPAROSCOPY, DIAGNOSTIC
Anesthesia: General | Site: Abdomen

## 2022-08-21 MED ORDER — ROCURONIUM BROMIDE 10 MG/ML (PF) SYRINGE
PREFILLED_SYRINGE | INTRAVENOUS | Status: AC
  Filled 2022-08-21: qty 10

## 2022-08-21 MED ORDER — OXYCODONE-ACETAMINOPHEN 5-325 MG PO TABS: 1.0000 | ORAL_TABLET | ORAL | 0 refills | Status: AC | PRN

## 2022-08-21 MED ORDER — DEXMEDETOMIDINE HCL IN NACL 200 MCG/50ML IV SOLN
INTRAVENOUS | Status: DC | PRN
  Administered 2022-08-21: 8 ug via INTRAVENOUS
  Administered 2022-08-21: 4 ug via INTRAVENOUS

## 2022-08-21 MED ORDER — ACETAMINOPHEN 160 MG/5ML PO SOLN: 325.0000 mg | ORAL | Status: DC | PRN

## 2022-08-21 MED ORDER — LIDOCAINE HCL (PF) 2 % IJ SOLN
INTRAMUSCULAR | Status: AC
  Filled 2022-08-21: qty 5

## 2022-08-21 MED ORDER — OXYCODONE HCL 5 MG/5ML PO SOLN: 5.0000 mg | Freq: Once | ORAL | Status: DC | PRN

## 2022-08-21 MED ORDER — CEFAZOLIN SODIUM-DEXTROSE 2-4 GM/100ML-% IV SOLN
2.0000 g | INTRAVENOUS | Status: AC
  Administered 2022-08-21: 2 g via INTRAVENOUS

## 2022-08-21 MED ORDER — FENTANYL CITRATE (PF) 100 MCG/2ML IJ SOLN
INTRAMUSCULAR | Status: AC
  Filled 2022-08-21: qty 2

## 2022-08-21 MED ORDER — OXYCODONE HCL 5 MG PO TABS: 5.0000 mg | ORAL_TABLET | Freq: Once | ORAL | Status: DC | PRN

## 2022-08-21 MED ORDER — KETOROLAC TROMETHAMINE 30 MG/ML IJ SOLN
INTRAMUSCULAR | Status: AC
  Filled 2022-08-21: qty 1

## 2022-08-21 MED ORDER — ACETAMINOPHEN 500 MG PO TABS
1000.0000 mg | ORAL_TABLET | Freq: Once | ORAL | Status: AC
  Administered 2022-08-21: 1000 mg via ORAL

## 2022-08-21 MED ORDER — ACETAMINOPHEN 325 MG PO TABS: 325.0000 mg | ORAL_TABLET | ORAL | Status: DC | PRN

## 2022-08-21 MED ORDER — CEFAZOLIN SODIUM-DEXTROSE 2-4 GM/100ML-% IV SOLN
INTRAVENOUS | Status: AC
  Filled 2022-08-21: qty 100

## 2022-08-21 MED ORDER — DEXAMETHASONE SODIUM PHOSPHATE 10 MG/ML IJ SOLN
INTRAMUSCULAR | Status: DC | PRN
  Administered 2022-08-21: 10 mg via INTRAVENOUS

## 2022-08-21 MED ORDER — FENTANYL CITRATE (PF) 100 MCG/2ML IJ SOLN
INTRAMUSCULAR | Status: DC | PRN
  Administered 2022-08-21: 50 ug via INTRAVENOUS
  Administered 2022-08-21 (×2): 25 ug via INTRAVENOUS

## 2022-08-21 MED ORDER — LACTATED RINGERS IV SOLN
INTRAVENOUS | Status: DC
  Administered 2022-08-21: 1000 mL via INTRAVENOUS

## 2022-08-21 MED ORDER — MIDAZOLAM HCL 2 MG/2ML IJ SOLN
INTRAMUSCULAR | Status: AC
  Filled 2022-08-21: qty 2

## 2022-08-21 MED ORDER — KETOROLAC TROMETHAMINE 30 MG/ML IJ SOLN
INTRAMUSCULAR | Status: DC | PRN
  Administered 2022-08-21: 30 mg via INTRAVENOUS

## 2022-08-21 MED ORDER — 0.9 % SODIUM CHLORIDE (POUR BTL) OPTIME
TOPICAL | Status: DC | PRN
  Administered 2022-08-21: 500 mL

## 2022-08-21 MED ORDER — ONDANSETRON HCL 4 MG/2ML IJ SOLN
INTRAMUSCULAR | Status: AC
  Filled 2022-08-21: qty 2

## 2022-08-21 MED ORDER — FENTANYL CITRATE (PF) 100 MCG/2ML IJ SOLN
25.0000 ug | INTRAMUSCULAR | Status: DC | PRN
  Administered 2022-08-21: 25 ug via INTRAVENOUS

## 2022-08-21 MED ORDER — DEXAMETHASONE SODIUM PHOSPHATE 10 MG/ML IJ SOLN
INTRAMUSCULAR | Status: AC
  Filled 2022-08-21: qty 1

## 2022-08-21 MED ORDER — ONDANSETRON HCL 4 MG/2ML IJ SOLN: 4.0000 mg | Freq: Once | INTRAMUSCULAR | Status: DC | PRN

## 2022-08-21 MED ORDER — PROPOFOL 10 MG/ML IV BOLUS
INTRAVENOUS | Status: AC
  Filled 2022-08-21: qty 20

## 2022-08-21 MED ORDER — MEPERIDINE HCL 25 MG/ML IJ SOLN: 6.2500 mg | INTRAMUSCULAR | Status: DC | PRN

## 2022-08-21 MED ORDER — BUPIVACAINE HCL (PF) 0.25 % IJ SOLN
INTRAMUSCULAR | Status: DC | PRN
  Administered 2022-08-21: 6 mL

## 2022-08-21 MED ORDER — PROPOFOL 10 MG/ML IV BOLUS
INTRAVENOUS | Status: DC | PRN
  Administered 2022-08-21: 150 mg via INTRAVENOUS

## 2022-08-21 MED ORDER — LACTATED RINGERS IV SOLN: INTRAVENOUS | Status: DC

## 2022-08-21 MED ORDER — CELECOXIB 200 MG PO CAPS
200.0000 mg | ORAL_CAPSULE | Freq: Once | ORAL | Status: AC
  Administered 2022-08-21: 200 mg via ORAL

## 2022-08-21 MED ORDER — ACETAMINOPHEN 500 MG PO TABS
ORAL_TABLET | ORAL | Status: AC
  Filled 2022-08-21: qty 2

## 2022-08-21 MED ORDER — DEXMEDETOMIDINE HCL IN NACL 80 MCG/20ML IV SOLN
INTRAVENOUS | Status: AC
  Filled 2022-08-21: qty 20

## 2022-08-21 MED ORDER — SODIUM CHLORIDE 0.9 % IR SOLN
Status: DC | PRN
  Administered 2022-08-21: 300 mL

## 2022-08-21 MED ORDER — CELECOXIB 200 MG PO CAPS
ORAL_CAPSULE | ORAL | Status: AC
  Filled 2022-08-21: qty 1

## 2022-08-21 MED ORDER — METHYLENE BLUE 1 % INJ SOLN
INTRAVENOUS | Status: DC | PRN
  Administered 2022-08-21: 5 mL

## 2022-08-21 MED ORDER — MIDAZOLAM HCL 5 MG/5ML IJ SOLN
INTRAMUSCULAR | Status: DC | PRN
  Administered 2022-08-21: 2 mg via INTRAVENOUS

## 2022-08-21 MED ORDER — LIDOCAINE HCL (CARDIAC) PF 100 MG/5ML IV SOSY
PREFILLED_SYRINGE | INTRAVENOUS | Status: DC | PRN
  Administered 2022-08-21: 60 mg via INTRAVENOUS

## 2022-08-21 MED ORDER — POVIDONE-IODINE 10 % EX SWAB: 2.0000 | Freq: Once | CUTANEOUS | Status: DC

## 2022-08-21 MED ORDER — ROCURONIUM BROMIDE 100 MG/10ML IV SOLN
INTRAVENOUS | Status: DC | PRN
  Administered 2022-08-21: 50 mg via INTRAVENOUS

## 2022-08-21 MED ORDER — IBUPROFEN 200 MG PO TABS: 800.0000 mg | ORAL_TABLET | Freq: Four times a day (QID) | ORAL | 0 refills | Status: AC | PRN

## 2022-08-21 MED ORDER — ONDANSETRON HCL 4 MG/2ML IJ SOLN
INTRAMUSCULAR | Status: DC | PRN
  Administered 2022-08-21: 4 mg via INTRAVENOUS

## 2022-08-21 MED ORDER — SUGAMMADEX SODIUM 200 MG/2ML IV SOLN
INTRAVENOUS | Status: DC | PRN
  Administered 2022-08-21: 200 mg via INTRAVENOUS

## 2022-08-21 SURGICAL SUPPLY — 60 items
ADH SKN CLS APL DERMABOND .7 (GAUZE/BANDAGES/DRESSINGS) ×1
APL SKNCLS STERI-STRIP NONHPOA (GAUZE/BANDAGES/DRESSINGS)
APL SWBSTK 6 STRL LF DISP (MISCELLANEOUS) ×1
APPLICATOR COTTON TIP 6 STRL (MISCELLANEOUS) IMPLANT
APPLICATOR COTTON TIP 6IN STRL (MISCELLANEOUS) ×1
BARRIER ADHS 3X4 INTERCEED (GAUZE/BANDAGES/DRESSINGS) IMPLANT
BENZOIN TINCTURE PRP APPL 2/3 (GAUZE/BANDAGES/DRESSINGS) IMPLANT
BRR ADH 4X3 ABS CNTRL BYND (GAUZE/BANDAGES/DRESSINGS)
CABLE HIGH FREQUENCY MONO STRZ (ELECTRODE) IMPLANT
CATH ROBINSON RED A/P 14FR (CATHETERS) ×1 IMPLANT
COVER MAYO STAND STRL (DRAPES) ×1 IMPLANT
DERMABOND ADVANCED .7 DNX12 (GAUZE/BANDAGES/DRESSINGS) IMPLANT
DRAPE SURG IRRIG POUCH 19X23 (DRAPES) ×1 IMPLANT
DRSG TELFA 3X8 NADH STRL (GAUZE/BANDAGES/DRESSINGS) IMPLANT
DURAPREP 26ML APPLICATOR (WOUND CARE) ×1 IMPLANT
ELECT REM PT RETURN 9FT ADLT (ELECTROSURGICAL) ×1
ELECTRODE REM PT RTRN 9FT ADLT (ELECTROSURGICAL) ×1 IMPLANT
FORCEPS CUTTING 45CM 5MM (CUTTING FORCEPS) IMPLANT
GAUZE 4X4 16PLY ~~LOC~~+RFID DBL (SPONGE) ×2 IMPLANT
GLOVE BIO SURGEON STRL SZ 6 (GLOVE) ×1 IMPLANT
GLOVE BIOGEL PI IND STRL 6 (GLOVE) ×2 IMPLANT
GLOVE ECLIPSE 6.0 STRL STRAW (GLOVE) ×1 IMPLANT
GLOVE SS PI  5.5 STRL (GLOVE)
GLOVE SS PI 5.5 STRL (GLOVE) ×1 IMPLANT
GOWN STRL REUS W/TWL LRG LVL3 (GOWN DISPOSABLE) ×3 IMPLANT
IRRIG SUCT STRYKERFLOW 2 WTIP (MISCELLANEOUS) ×1
IRRIGATION SUCT STRKRFLW 2 WTP (MISCELLANEOUS) IMPLANT
IV NS 1000ML (IV SOLUTION) ×1
IV NS 1000ML BAXH (IV SOLUTION) IMPLANT
KIT PINK PAD W/HEAD ARE REST (MISCELLANEOUS) ×1
KIT PINK PAD W/HEAD ARM REST (MISCELLANEOUS) ×2 IMPLANT
KIT TURNOVER CYSTO (KITS) ×1 IMPLANT
LIGASURE LAP L-HOOKWIRE 5 44CM (INSTRUMENTS) IMPLANT
NDL INSUFFLATION 14GA 120MM (NEEDLE) ×1 IMPLANT
NEEDLE INSUFFLATION 14GA 120MM (NEEDLE) ×1 IMPLANT
NS IRRIG 500ML POUR BTL (IV SOLUTION) ×1 IMPLANT
PACK LAPAROSCOPY BASIN (CUSTOM PROCEDURE TRAY) ×1 IMPLANT
PAD OB MATERNITY 4.3X12.25 (PERSONAL CARE ITEMS) ×1 IMPLANT
PENCIL SMOKE EVACUATOR (MISCELLANEOUS) IMPLANT
SCISSORS LAP 5X35 DISP (ENDOMECHANICALS) IMPLANT
SEALER TISSUE G2 CVD JAW 45CM (ENDOMECHANICALS) IMPLANT
SET SUCTION IRRIG HYDROSURG (IRRIGATION / IRRIGATOR) IMPLANT
SET TUBE SMOKE EVAC HIGH FLOW (TUBING) ×1 IMPLANT
SLEEVE SCD COMPRESS KNEE MED (STOCKING) ×1 IMPLANT
STRIP CLOSURE SKIN 1/2X4 (GAUZE/BANDAGES/DRESSINGS) IMPLANT
STRIP CLOSURE SKIN 1/4X4 (GAUZE/BANDAGES/DRESSINGS) IMPLANT
SUT MNCRL AB 3-0 PS2 18 (SUTURE) ×1 IMPLANT
SUT VICRYL 0 UR6 27IN ABS (SUTURE) ×1 IMPLANT
SYR 10ML LL (SYRINGE) ×1 IMPLANT
SYR 20ML LL LF (SYRINGE) IMPLANT
SYR 50ML LL SCALE MARK (SYRINGE) IMPLANT
SYS BAG RETRIEVAL 10MM (BASKET)
SYSTEM BAG RETRIEVAL 10MM (BASKET) IMPLANT
TOWEL OR 17X24 6PK STRL BLUE (TOWEL DISPOSABLE) ×2 IMPLANT
TROCAR Z-THREAD FIOS 11X100 BL (TROCAR) ×1 IMPLANT
TROCAR Z-THREAD FIOS 5X100MM (TROCAR) ×1 IMPLANT
TUBE CONNECTING 12X1/4 (SUCTIONS) IMPLANT
TUBING EXTENTION W/L.L. (IV SETS) IMPLANT
WARMER LAPAROSCOPE (MISCELLANEOUS) ×1 IMPLANT
WATER STERILE IRR 500ML POUR (IV SOLUTION) ×1 IMPLANT

## 2022-08-21 NOTE — Op Note (Signed)
[name] [name] 08/21/2022  PREOPERATIVE DIAGNOSIS:  Pelvic pain, dyspareunia, infertility  POSTOPERATIVE DIAGNOSIS:  SAA with endometriosis  PROCEDURE:  Diagnostic laparoscopy, removal of endometriosis, chromopertubation  ANESTHESIA:  General endotracheal  COMPLICATIONS:  None immediate.  ESTIMATED BLOOD LOSS:  Less than 20 ml.  PATHOLOGY:  peritoneal biopsies   INDICATIONS: 32 y.o.  with pelvic pain, dyspareunia and infertility.        FINDINGS:  Normal uterus, normal ovaries bilaterally.  Bilateral fallopian tubes dilated and mildly blunted fimbriated ends.  Normal appearing liver edge and gallbladder.  Retrocecal appendix.  Gunpowder lesion left bladder peritoneum, left utero-ovarian fossa and right utero-ovarian fossa.  Peritoneal window in posterior cul-de-sac.  Small amount of menstrual blood in pelvis.  On chromopertubation, bilateral fallopian tube spill of methylene blue.    TECHNIQUE:  The patient was taken to the operating room where general anesthesia was obtained without difficulty.  She was then placed in the dorsal lithotomy position and prepared and draped in sterile fashion.  In and out catheter drained 15 cc clear urine.  After an adequate timeout was performed, a bivalved speculum was then placed in the patient's vagina, and the anterior lip of cervix grasped with the single-tooth tenaculum.  The hulka clip was advanced into the uterus.  50 cc NS mixed with 1 cc methylene blue in syringe was hooked to tubing and to manipulator.  The speculum was removed from the vagina.  Attention was then turned to the patient's abdomen where a 10-mm skin incision was made on the umbilical fold.  The Veress needle was carefully introduced into the peritoneal cavity through the abdominal wall.  Intraperitoneal placement was confirmed by drop in intraabdominal pressure with insufflation of carbon dioxide gas.  Adequate pneumoperitoneum was obtained, and the 10/11 XL trocar was then advanced  without difficulty into the abdomen where intraabdominal placement was confirmed by the operative laparoscope.  A 5-mm skin incision was made in the suprapubic region and 5 mm trocar was advanced under direct visualization.  Survey of abdomen and pelvic revealed above dictated findings.  Suction irrigation was performed to complete clear menstrual blood and to allow better visualization of anatomy.  Using a rat tooth grasper, left sided bladder lesion, left lesion in utero-ovarian fossa, and right lesion in utero-ovarian fossa were removed and sent to pathology.  Hemostasis was observed at all bx sites.  At this time, attention was turned to chromopertubation.  Bilateral fallopian tubes were simultaneously visualized while methylene blue mixture was injected.  Spill from bilateral fallopian tubes was seen promptly.  Methylene blue fluid was suctioned from pelvis.  All instruments were then removed from the pelvis.  Infraumbilical fascial incision was closed using 0 vicryl in a figure of eight stitch.  Skin incisions were both closed using 2-0 monocryl in a subcuticular fashion.  Dermabond was applied to both skin incision.  The uterine manipulator and the tenaculum were removed from the vagina without complications. The patient tolerated the procedure well.  Sponge, lap, and needle counts were correct times two.  The patient was then taken to the recovery room awake, extubated and in stable  in stable condition.

## 2022-08-21 NOTE — Anesthesia Procedure Notes (Signed)
Procedure Name: Intubation Date/Time: 08/21/2022 7:36 AM  Performed by: Rogers Blocker, CRNAPre-anesthesia Checklist: Patient identified, Emergency Drugs available, Suction available and Patient being monitored Patient Re-evaluated:Patient Re-evaluated prior to induction Oxygen Delivery Method: Circle System Utilized Preoxygenation: Pre-oxygenation with 100% oxygen Induction Type: IV induction Ventilation: Mask ventilation without difficulty Laryngoscope Size: Mac and 3 Grade View: Grade I Tube type: Oral Tube size: 7.0 mm Number of attempts: 1 Airway Equipment and Method: Stylet and Bite block Placement Confirmation: ETT inserted through vocal cords under direct vision, positive ETCO2 and breath sounds checked- equal and bilateral Secured at: 22 cm Tube secured with: Tape Dental Injury: Teeth and Oropharynx as per pre-operative assessment  Comments: Intubation performed by Shelah Lewandowsky, SRNA.

## 2022-08-21 NOTE — Transfer of Care (Signed)
Immediate Anesthesia Transfer of Care Note  Patient: [name] [name]  Procedure(s) Performed: LAPAROSCOPY DIAGNOSTIC; REMOVAL OF ENDOMETRIOSIS (Abdomen) CHROMOPERTUBATION (Abdomen)  Patient Location: PACU  Anesthesia Type:General  Level of Consciousness: awake, alert , oriented, and patient cooperative  Airway & Oxygen Therapy: Patient Spontanous Breathing and Patient connected to nasal cannula oxygen  Post-op Assessment: Report given to RN and Post -op Vital signs reviewed and stable  Post vital signs: Reviewed and stable  Last Vitals:  Vitals Value Taken Time  BP 123/92 08/21/22 0853  Temp    Pulse 88 08/21/22 0854  Resp 16 08/21/22 0854  SpO2 100 % 08/21/22 0854  Vitals shown include unvalidated device data.  Last Pain:  Vitals:   08/21/22 0608  TempSrc:   PainSc: 4       Patients Stated Pain Goal: 6 ([id number])  Complications: No notable events documented.

## 2022-08-21 NOTE — Progress Notes (Signed)
No change to H&P.  Plan Dx L/S, possible removal of endometriosis, chromopertubation.  All questions were answered.  Linda Hedges, DO

## 2022-08-21 NOTE — Discharge Instructions (Addendum)
Call MD for T>100.4, heavy vaginal bleeding, severe abdominal pain, intractable nausea and/or vomiting, or respiratory distress.  Pelvic rest x 4 weeks.  No heavy lifting.  No driving while taking narcotics.  Follow up at postop visit in 2 weeks.       No acetaminophen/Tylenol until after 12:30 pm today if needed.  No ibuprofen, Advil, Aleve, Motrin, ketorolac, meloxicam, naproxen, or other NSAIDS until after 12:30 pm today if needed.   Post Anesthesia Home Care Instructions  Activity: Get plenty of rest for the remainder of the day. A responsible individual must stay with you for 24 hours following the procedure.  For the next 24 hours, DO NOT: -Drive a car -Paediatric nurse -Drink alcoholic beverages -Take any medication unless instructed by your physician -Make any legal decisions or sign important papers.  Meals: Start with liquid foods such as gelatin or soup. Progress to regular foods as tolerated. Avoid greasy, spicy, heavy foods. If nausea and/or vomiting occur, drink only clear liquids until the nausea and/or vomiting subsides. Call your physician if vomiting continues.  Special Instructions/Symptoms: Your throat may feel dry or sore from the anesthesia or the breathing tube placed in your throat during surgery. If this causes discomfort, gargle with warm salt water. The discomfort should disappear within 24 hours.

## 2022-08-23 NOTE — Anesthesia Postprocedure Evaluation (Signed)
Anesthesia Post Note  Patient: [name] [name]  Procedure(s) Performed: LAPAROSCOPY DIAGNOSTIC; REMOVAL OF ENDOMETRIOSIS (Abdomen) CHROMOPERTUBATION (Abdomen)     Patient location during evaluation: PACU Anesthesia Type: General Level of consciousness: awake and alert Pain management: pain level controlled Vital Signs Assessment: post-procedure vital signs reviewed and stable Respiratory status: spontaneous breathing, nonlabored ventilation, respiratory function stable and patient connected to nasal cannula oxygen Cardiovascular status: blood pressure returned to baseline and stable Postop Assessment: no apparent nausea or vomiting Anesthetic complications: no   No notable events documented.  Last Vitals:  Vitals:   08/21/22 0930 08/21/22 1014  BP: 120/85 118/89  Pulse: 74 80  Resp: (!) 21 16  Temp:  36.8 C  SpO2: 99% 98%    Last Pain:  Vitals:   08/21/22 1014  TempSrc: Oral  PainSc:                  Jolanda Mccann

## 2022-08-24 ENCOUNTER — Encounter (HOSPITAL_BASED_OUTPATIENT_CLINIC_OR_DEPARTMENT_OTHER): Payer: Self-pay | Admitting: Obstetrics & Gynecology

## 2022-08-24 LAB — SURGICAL PATHOLOGY
